# Patient Record
Sex: Female | Born: 1985 | Race: White | Hispanic: No | Marital: Married | Smoking: Never smoker
Health system: Southern US, Community
[De-identification: ages and names within clinical notes are randomized; demographics above are authoritative.]

## PROBLEM LIST (undated history)

## (undated) DIAGNOSIS — R519 Headache, unspecified: Secondary | ICD-10-CM

## (undated) DIAGNOSIS — R51 Headache: Secondary | ICD-10-CM

## (undated) DIAGNOSIS — K589 Irritable bowel syndrome without diarrhea: Secondary | ICD-10-CM

## (undated) HISTORY — DX: Irritable bowel syndrome, unspecified: K58.9

## (undated) HISTORY — DX: Headache, unspecified: R51.9

## (undated) HISTORY — PX: RHINOPLASTY: SUR1284

## (undated) HISTORY — DX: Headache: R51

---
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
## 2005-08-08 ENCOUNTER — Other Ambulatory Visit: Admission: RE | Admit: 2005-08-08 | Discharge: 2005-08-08 | Payer: Self-pay | Admitting: Gynecology

## 2005-11-10 ENCOUNTER — Other Ambulatory Visit: Admission: RE | Admit: 2005-11-10 | Discharge: 2005-11-10 | Payer: Self-pay | Admitting: Gynecology

## 2006-06-22 ENCOUNTER — Other Ambulatory Visit: Admission: RE | Admit: 2006-06-22 | Discharge: 2006-06-22 | Payer: Self-pay | Admitting: Gynecology

## 2006-08-10 DIAGNOSIS — D229 Melanocytic nevi, unspecified: Secondary | ICD-10-CM

## 2006-08-10 HISTORY — DX: Melanocytic nevi, unspecified: D22.9

## 2006-12-26 ENCOUNTER — Other Ambulatory Visit: Admission: RE | Admit: 2006-12-26 | Discharge: 2006-12-26 | Payer: Self-pay | Admitting: Gynecology

## 2008-01-07 ENCOUNTER — Encounter: Payer: Self-pay | Admitting: Women's Health

## 2008-01-07 ENCOUNTER — Ambulatory Visit: Payer: Self-pay | Admitting: Women's Health

## 2008-01-07 ENCOUNTER — Other Ambulatory Visit: Admission: RE | Admit: 2008-01-07 | Discharge: 2008-01-07 | Payer: Self-pay | Admitting: Gynecology

## 2010-05-27 NOTE — Op Note (Signed)
NAME:  Maria Schultz, Maria Schultz                            ACCOUNT NO.:  [id number]   MEDICAL RECORD NO.:  [id number]                   PATIENT TYPE:  AMB   LOCATION:  [facility]                                 FACILITY:  [facility]   PHYSICIAN:  Joanna Puff, M.D.            DATE OF BIRTH:  27-Mar-1985   DATE OF PROCEDURE:  DATE OF DISCHARGE:  06/30/2003                                 OPERATIVE REPORT   PREOPERATIVE DIAGNOSES:  1. Deviated septum secondary to trauma.  2. Nasal deformity secondary to trauma.   POSTOPERATIVE DIAGNOSES:  1. Deviated septum secondary to trauma.  2. Nasal deformity secondary to trauma.   OPERATION:  1. Nasal septoplasty.  2. Open reduction of nasal fracture.   SURGEON:  Joanna Puff, M.D.   ANESTHESIA:  General endotracheal by Ms. Aundria Rud.   DESCRIPTION OF PROCEDURE:  The patient was brought to the operating room and  placed in the supine position.  After adequate general endotracheal  anesthesia had been obtained, the head was draped in the usual manner.  Supplemental anesthesia was given through the nose by blocking and  infiltrating the nose with 1% Xylocaine with adrenaline and topical  application of 4% cocaine solution intranasally.   After adequate time for the local to work well, bilateral intercartilaginous  incisions were made and the upper lateral cartilages exposed.  Small  returning of the upper lateral cartilage was trimmed. The skin was then  further undermined over the dorsum of nose.  A rim incision was made and the  lower lateral cartilage were delivered. The ballooning margin of the lateral  crus was corrected with a complete strip technique.  A button end knife was  then inserted through the intercartilaginous incision and brought over the  caudal nose making for a complete transfixion incision.  Bilateral  mucoperichondrial and mucoperiosteal flaps were elevated over a severely  deviated septum which was markedly in the left airway  producing almost total  obstruction. The cartilaginous deflection was identified. It was then  removed by means of dissection. The marked deviation of the bony crest as  well as the perpendicular plate of the ethmoid and vomer was corrected with  chisel and rongeur. This allowed the septum to swing free back to the  midline. Out fracture of each inferior turbinate was then carried out.  Submucoperichondrial tunnels were then made beneath the junction of the  upper lateral cartilage and cartilaginous dorsum.  These two structures were  separated with angular scissors. The cartilaginous dorsum were then further  lowered slightly.  A small asymmetrical bony pyramid deformity which existed  from her injury was then removed.  Upper lateral cartilage and cartilaginous  dorsum were then further lowered to conform to the new height of the nasal  bony pyramid.  A vestibular incision was made and lateral osteotomies were  performed with the guarded chisel.  In fracture of nasal bone was  carried  out. Inspection revealed the septum and pyramid to be in good alignment.  The transfixion incision was closed with base suture of 3-0 plain catgut as  well as interrupted suture of 5-0 plain catgut. The rim incision was closed  with interrupted suture of 5-0 plain catgut. The intranasal splints were  inserted and sutured in place with 3-0 Ethilon. An external splint was  applied.  The patient was then awakened, extubated and returned to the  recovery room for reactivity prior to being discharged home.  She is  scheduled in the office in 24 hours for routine followup.   DISCHARGE MEDICATIONS:  1. Cephalexin 250, 1 q.i.d.  2. Sterapred 5 mg Dosepak.  3. Vicodin 7.5 mg 1 tablet q.3-4 h. p.r.n. for pain.   CONDITION ON DISCHARGE:  Good.   COMPLICATIONS:  None.                                               Joanna Puff, M.D.    LLP/MEDQ  D:  07/02/2003  T:  07/03/2003  Job:  [id number]

## 2013-01-10 ENCOUNTER — Encounter (HOSPITAL_COMMUNITY): Payer: Self-pay | Admitting: Emergency Medicine

## 2013-01-10 ENCOUNTER — Emergency Department (HOSPITAL_COMMUNITY)
Admission: EM | Admit: 2013-01-10 | Discharge: 2013-01-10 | Disposition: A | Discharge status: Home / Self Care | Payer: BC Managed Care – PPO | Source: Home / Self Care | Attending: Family Medicine | Admitting: Family Medicine

## 2013-01-10 DIAGNOSIS — J069 Acute upper respiratory infection, unspecified: Secondary | ICD-10-CM

## 2013-01-10 MED ORDER — IPRATROPIUM BROMIDE 0.06 % NA SOLN: 2.0000 | Freq: Four times a day (QID) | NASAL | Status: DC

## 2013-01-10 NOTE — ED Notes (Signed)
Pt  Reports     Symptoms      Of   Cough   Congested       And  sorethroat  With  The  Symptoms  X    4  Days    She  Is  Sitting  Upright on  Exam table  In no  Acute  Distress   Speaking in  Complete  sentances

## 2013-01-10 NOTE — ED Provider Notes (Signed)
CSN: [id number]     Arrival date & time 01/10/13  6294 History   First MD Initiated Contact with Patient 01/10/13 [phone]     Chief Complaint  Patient presents with  . URI   (Consider location/radiation/quality/duration/timing/severity/associated sxs/prior Treatment) Patient is a 28 y.o. female presenting with URI. The history is provided by the patient.  URI Presenting symptoms: congestion, rhinorrhea and sore throat   Presenting symptoms: no cough and no fever   Severity:  Mild Onset quality:  Gradual Duration:  5 days Progression:  Unchanged Chronicity:  New Ineffective treatments:  OTC medications Risk factors: sick contacts     History reviewed. No pertinent past medical history. History reviewed. No pertinent past surgical history. History reviewed. No pertinent family history. History  Substance Use Topics  . Smoking status: Never Smoker   . Smokeless tobacco: Not on file  . Alcohol Use: Yes   OB History   Grav Para Term Preterm Abortions TAB SAB Ect Mult Living                 Review of Systems  Constitutional: Negative.  Negative for fever.  HENT: Positive for congestion, postnasal drip, rhinorrhea and sore throat.   Respiratory: Negative for cough.     Allergies  Review of patient's allergies indicates no known allergies.  Home Medications   Current Outpatient Rx  Name  Route  Sig  Dispense  Refill  . ipratropium (ATROVENT) 0.06 % nasal spray   Nasal   Place 2 sprays into the nose 4 (four) times daily.   15 mL   1    BP 121/80  Pulse 90  Temp(Src) 98.3 F (36.8 C) (Oral)  Resp 14  SpO2 98%  LMP 12/25/2012 Physical Exam  Nursing note and vitals reviewed. Constitutional: She is oriented to person, place, and time. She appears well-developed and well-nourished.  HENT:  Head: Normocephalic.  Right Ear: External ear normal.  Left Ear: External ear normal.  Mouth/Throat: Oropharynx is clear and moist.  Eyes: Conjunctivae and EOM are normal. Pupils  are equal, round, and reactive to light.  Neck: Normal range of motion. Neck supple.  Pulmonary/Chest: Effort normal and breath sounds normal.  Lymphadenopathy:    She has no cervical adenopathy.  Neurological: She is alert and oriented to person, place, and time.  Skin: Skin is warm and dry.    ED Course  Procedures (including critical care time) Labs Review Labs Reviewed - No data to display Imaging Review No results found.  EKG Interpretation    Date/Time:    Ventricular Rate:    PR Interval:    QRS Duration:   QT Interval:    QTC Calculation:   R Axis:     Text Interpretation:              MDM      Billy Fischer, MD 01/10/13 [phone]

## 2013-01-10 NOTE — Discharge Instructions (Signed)
Drink plenty of fluids as discussed, use medicine as prescribed, and mucinex or delsym for cough. Return or see your doctor if further problems °

## 2014-01-28 ENCOUNTER — Other Ambulatory Visit: Payer: Self-pay | Admitting: Hematology and Oncology

## 2014-01-28 DIAGNOSIS — R591 Generalized enlarged lymph nodes: Secondary | ICD-10-CM

## 2014-01-30 ENCOUNTER — Ambulatory Visit (HOSPITAL_COMMUNITY)
Admission: RE | Admit: 2014-01-30 | Discharge: 2014-01-30 | Disposition: A | Discharge status: Home / Self Care | Payer: 59 | Source: Ambulatory Visit | Attending: Hematology and Oncology | Admitting: Hematology and Oncology

## 2014-01-30 ENCOUNTER — Encounter (HOSPITAL_COMMUNITY): Payer: Self-pay

## 2014-01-30 DIAGNOSIS — Z87440 Personal history of urinary (tract) infections: Secondary | ICD-10-CM | POA: Diagnosis not present

## 2014-01-30 DIAGNOSIS — R591 Generalized enlarged lymph nodes: Secondary | ICD-10-CM

## 2014-01-30 DIAGNOSIS — I890 Lymphedema, not elsewhere classified: Secondary | ICD-10-CM | POA: Insufficient documentation

## 2014-01-30 MED ORDER — IOHEXOL 300 MG/ML  SOLN
100.0000 mL | Freq: Once | INTRAMUSCULAR | Status: AC | PRN
  Administered 2014-01-30: 100 mL via INTRAVENOUS

## 2014-03-03 LAB — OB RESULTS CONSOLE RUBELLA ANTIBODY, IGM: RUBELLA: IMMUNE

## 2014-03-03 LAB — OB RESULTS CONSOLE HEPATITIS B SURFACE ANTIGEN: Hepatitis B Surface Ag: NEGATIVE

## 2014-03-03 LAB — OB RESULTS CONSOLE GC/CHLAMYDIA
Chlamydia: NEGATIVE
Gonorrhea: NEGATIVE

## 2014-03-03 LAB — OB RESULTS CONSOLE RPR: RPR: NONREACTIVE

## 2014-03-03 LAB — OB RESULTS CONSOLE ABO/RH: RH TYPE: POSITIVE

## 2014-03-03 LAB — OB RESULTS CONSOLE ANTIBODY SCREEN: Antibody Screen: NEGATIVE

## 2014-03-03 LAB — OB RESULTS CONSOLE HIV ANTIBODY (ROUTINE TESTING): HIV: NONREACTIVE

## 2014-09-21 LAB — OB RESULTS CONSOLE GBS: GBS: POSITIVE

## 2014-10-14 ENCOUNTER — Encounter (HOSPITAL_COMMUNITY): Payer: Self-pay | Admitting: *Deleted

## 2014-10-14 ENCOUNTER — Telehealth (HOSPITAL_COMMUNITY): Payer: Self-pay | Admitting: *Deleted

## 2014-10-14 NOTE — Telephone Encounter (Signed)
Preadmission screen  

## 2014-10-15 ENCOUNTER — Inpatient Hospital Stay (HOSPITAL_COMMUNITY)
Admission: RE | Admit: 2014-10-15 | Discharge: 2014-10-18 | DRG: 775 | Disposition: A | Discharge status: Home / Self Care | Payer: 59 | Source: Ambulatory Visit | Attending: Obstetrics and Gynecology | Admitting: Obstetrics and Gynecology

## 2014-10-15 ENCOUNTER — Encounter (HOSPITAL_COMMUNITY): Payer: Self-pay

## 2014-10-15 ENCOUNTER — Inpatient Hospital Stay (HOSPITAL_COMMUNITY): Payer: 59 | Admitting: Anesthesiology

## 2014-10-15 DIAGNOSIS — Z809 Family history of malignant neoplasm, unspecified: Secondary | ICD-10-CM

## 2014-10-15 DIAGNOSIS — O480 Post-term pregnancy: Secondary | ICD-10-CM | POA: Diagnosis present

## 2014-10-15 DIAGNOSIS — Z3A40 40 weeks gestation of pregnancy: Secondary | ICD-10-CM

## 2014-10-15 DIAGNOSIS — Z8249 Family history of ischemic heart disease and other diseases of the circulatory system: Secondary | ICD-10-CM

## 2014-10-15 DIAGNOSIS — Z833 Family history of diabetes mellitus: Secondary | ICD-10-CM

## 2014-10-15 LAB — COMPREHENSIVE METABOLIC PANEL
ALBUMIN: 2.7 g/dL — AB (ref 3.5–5.0)
ALT: 21 U/L (ref 14–54)
ANION GAP: 5 (ref 5–15)
AST: 26 U/L (ref 15–41)
Alkaline Phosphatase: 138 U/L — ABNORMAL HIGH (ref 38–126)
BILIRUBIN TOTAL: 0.5 mg/dL (ref 0.3–1.2)
BUN: 10 mg/dL (ref 6–20)
CHLORIDE: 107 mmol/L (ref 101–111)
CO2: 24 mmol/L (ref 22–32)
Calcium: 8.5 mg/dL — ABNORMAL LOW (ref 8.9–10.3)
Creatinine, Ser: 0.65 mg/dL (ref 0.44–1.00)
GFR calc Af Amer: >60 mL/min (ref >60)
Glucose, Bld: 87 mg/dL (ref 65–99)
POTASSIUM: 3.9 mmol/L (ref 3.5–5.1)
Sodium: 136 mmol/L (ref 135–145)
TOTAL PROTEIN: 5.6 g/dL — AB (ref 6.5–8.1)

## 2014-10-15 LAB — CBC
HCT: 38.7 % (ref 36.0–46.0)
HEMOGLOBIN: 13.1 g/dL (ref 12.0–15.0)
MCH: 29.7 pg (ref 26.0–34.0)
MCHC: 33.9 g/dL (ref 30.0–36.0)
MCV: 87.8 fL (ref 78.0–100.0)
PLATELETS: 144 10*3/uL — AB (ref 150–400)
RBC: 4.41 MIL/uL (ref 3.87–5.11)
RDW: 14.9 % (ref 11.5–15.5)
WBC: 9.6 10*3/uL (ref 4.0–10.5)

## 2014-10-15 LAB — RPR: RPR: NONREACTIVE

## 2014-10-15 LAB — TYPE AND SCREEN
ABO/RH(D): A POS
ANTIBODY SCREEN: NEGATIVE

## 2014-10-15 LAB — URIC ACID: URIC ACID, SERUM: 4.8 mg/dL (ref 2.3–6.6)

## 2014-10-15 LAB — ABO/RH: ABO/RH(D): A POS

## 2014-10-15 MED ORDER — PHENYLEPHRINE 40 MCG/ML (10ML) SYRINGE FOR IV PUSH (FOR BLOOD PRESSURE SUPPORT)
80.0000 ug | PREFILLED_SYRINGE | INTRAVENOUS | Status: DC | PRN
  Filled 2014-10-15: qty 20

## 2014-10-15 MED ORDER — ONDANSETRON HCL 4 MG/2ML IJ SOLN: 4.0000 mg | Freq: Four times a day (QID) | INTRAMUSCULAR | Status: DC | PRN

## 2014-10-15 MED ORDER — CITRIC ACID-SODIUM CITRATE 334-500 MG/5ML PO SOLN: 30.0000 mL | ORAL | Status: DC | PRN

## 2014-10-15 MED ORDER — OXYTOCIN BOLUS FROM INFUSION
500.0000 mL | INTRAVENOUS | Status: DC
  Administered 2014-10-16: 500 mL via INTRAVENOUS

## 2014-10-15 MED ORDER — LACTATED RINGERS IV SOLN
500.0000 mL | INTRAVENOUS | Status: DC | PRN
  Administered 2014-10-15: 500 mL via INTRAVENOUS

## 2014-10-15 MED ORDER — PENICILLIN G POTASSIUM 5000000 UNITS IJ SOLR
2.5000 10*6.[IU] | INTRAVENOUS | Status: DC
  Administered 2014-10-15 (×5): 2.5 10*6.[IU] via INTRAVENOUS
  Filled 2014-10-15 (×10): qty 2.5

## 2014-10-15 MED ORDER — FENTANYL 2.5 MCG/ML BUPIVACAINE 1/10 % EPIDURAL INFUSION (WH - ANES)
14.0000 mL/h | INTRAMUSCULAR | Status: DC | PRN
  Administered 2014-10-15 – 2014-10-16 (×3): 14 mL/h via EPIDURAL
  Filled 2014-10-15 (×2): qty 125

## 2014-10-15 MED ORDER — LIDOCAINE HCL (PF) 1 % IJ SOLN
INTRAMUSCULAR | Status: DC | PRN
  Administered 2014-10-15: 4 mL via EPIDURAL
  Administered 2014-10-15: 2 mL via EPIDURAL
  Administered 2014-10-15: 4 mL via EPIDURAL

## 2014-10-15 MED ORDER — OXYTOCIN 40 UNITS IN LACTATED RINGERS INFUSION - SIMPLE MED: 62.5000 mL/h | INTRAVENOUS | Status: DC

## 2014-10-15 MED ORDER — DEXTROSE 5 % IV SOLN
5.0000 10*6.[IU] | Freq: Once | INTRAVENOUS | Status: AC
  Administered 2014-10-15: 5 10*6.[IU] via INTRAVENOUS
  Filled 2014-10-15: qty 5

## 2014-10-15 MED ORDER — DIPHENHYDRAMINE HCL 50 MG/ML IJ SOLN: 12.5000 mg | INTRAMUSCULAR | Status: DC | PRN

## 2014-10-15 MED ORDER — OXYCODONE-ACETAMINOPHEN 5-325 MG PO TABS
1.0000 | ORAL_TABLET | ORAL | Status: DC | PRN
Start: 2014-10-15 — End: 2014-10-16

## 2014-10-15 MED ORDER — ACETAMINOPHEN 325 MG PO TABS
650.0000 mg | ORAL_TABLET | ORAL | Status: DC | PRN
  Administered 2014-10-15: 650 mg via ORAL
  Filled 2014-10-15: qty 2

## 2014-10-15 MED ORDER — MISOPROSTOL 25 MCG QUARTER TABLET
25.0000 ug | ORAL_TABLET | ORAL | Status: DC | PRN
  Administered 2014-10-15: 25 ug via VAGINAL
  Filled 2014-10-15: qty 0.25

## 2014-10-15 MED ORDER — LACTATED RINGERS IV SOLN
INTRAVENOUS | Status: DC
  Administered 2014-10-15 – 2014-10-16 (×4): via INTRAVENOUS

## 2014-10-15 MED ORDER — ZOLPIDEM TARTRATE 5 MG PO TABS: 5.0000 mg | ORAL_TABLET | Freq: Every evening | ORAL | Status: DC | PRN

## 2014-10-15 MED ORDER — OXYTOCIN 40 UNITS IN LACTATED RINGERS INFUSION - SIMPLE MED
1.0000 m[IU]/min | INTRAVENOUS | Status: DC
  Administered 2014-10-15: 2 m[IU]/min via INTRAVENOUS
  Filled 2014-10-15: qty 1000

## 2014-10-15 MED ORDER — LIDOCAINE HCL (PF) 1 % IJ SOLN
30.0000 mL | INTRAMUSCULAR | Status: AC | PRN
  Administered 2014-10-16: 30 mL via SUBCUTANEOUS
  Filled 2014-10-15: qty 30

## 2014-10-15 MED ORDER — TERBUTALINE SULFATE 1 MG/ML IJ SOLN: 0.2500 mg | Freq: Once | INTRAMUSCULAR | Status: DC | PRN

## 2014-10-15 MED ORDER — FLEET ENEMA 7-19 GM/118ML RE ENEM: 1.0000 | ENEMA | RECTAL | Status: DC | PRN

## 2014-10-15 MED ORDER — OXYCODONE-ACETAMINOPHEN 5-325 MG PO TABS: 2.0000 | ORAL_TABLET | ORAL | Status: DC | PRN

## 2014-10-15 MED ORDER — EPHEDRINE 5 MG/ML INJ
10.0000 mg | INTRAVENOUS | Status: DC | PRN
Start: 2014-10-15 — End: 2014-10-16

## 2014-10-15 NOTE — H&P (Signed)
Maria Schultz is a 29 y.o. female presenting for post dates induction of labor. Received one dose of Cytotec last pm. Maternal Medical History:  Fetal activity: Perceived fetal activity is normal.      OB History    Gravida Para Term Preterm AB TAB SAB Ectopic Multiple Living   1              Past Medical History  Diagnosis Date  . IBS (irritable bowel syndrome)   . Headache    Past Surgical History  Procedure Laterality Date  . Rhinoplasty     Family History: family history includes Cancer in her maternal grandfather and maternal grandmother; Diabetes in her paternal grandmother; Hypertension in her maternal grandmother; Migraines in her sister. Social History:  reports that she has never smoked. She has never used smokeless tobacco. She reports that she does not drink alcohol or use illicit drugs.   Prenatal Transfer Tool  Maternal Diabetes: No Genetic Screening: Normal Maternal Ultrasounds/Referrals: Normal Fetal Ultrasounds or other Referrals:  None Maternal Substance Abuse:  No Significant Maternal Medications:  None Significant Maternal Lab Results:  None Other Comments:  None  Review of Systems  Eyes: Negative for blurred vision.  Gastrointestinal: Negative for abdominal pain.  Neurological: Negative for headaches.    Dilation: 1 Effacement (%): Thick Station: -3 Exam by:: L.Stubbs, RN Blood pressure 121/74, pulse 73, temperature 97.6 F (36.4 C), temperature source Oral, resp. rate 18, height 5\' 6"  (1.676 m), weight 168 lb (76.204 kg), last menstrual period 01/04/2014. Maternal Exam:  Abdomen: Fetal presentation: vertex     Fetal Exam Fetal State Assessment: Category I - tracings are normal.     Physical Exam  Cardiovascular: Normal rate and regular rhythm.   Respiratory: Effort normal and breath sounds normal.  GI: Soft. There is no tenderness.    Filed Vitals:   10/15/14 0801  BP: 121/74  Pulse: 73  Temp: 97.6 F (36.4 C)  Resp: 18     Prenatal labs: ABO, Rh: --/--/A POS, A POS (10/06 0105) Antibody: NEG (10/06 0105) Rubella: Immune (02/23 0000) RPR: Nonreactive (02/23 0000)  HBsAg: Negative (02/23 0000)  HIV: Non-reactive (02/23 0000)  GBS: Positive (09/12 0000)   Assessment/Plan: 29 yo G1P0 for induction Pitocin now on BP 130s/90s x 1, no preeclampsia sxs Will check CMET and Uric acid   Maria Schultz,Maria Schultz 10/15/2014, 8:18 AM

## 2014-10-15 NOTE — Progress Notes (Signed)
FHT cat one UCs q2-4 Cx 4-5/C/-1 IUPC placed

## 2014-10-15 NOTE — Anesthesia Preprocedure Evaluation (Signed)
Anesthesia Evaluation  Patient identified by MRN, date of birth, ID band Patient awake    Reviewed: Allergy & Precautions, H&P , NPO status , Patient's Chart, lab work & pertinent test results  Airway Mallampati: II  TM Distance: >3 FB Neck ROM: full    Dental no notable dental hx.    Pulmonary neg pulmonary ROS,    Pulmonary exam normal breath sounds clear to auscultation       Cardiovascular negative cardio ROS Normal cardiovascular exam Rhythm:regular Rate:Normal     Neuro/Psych negative neurological ROS  negative psych ROS   GI/Hepatic negative GI ROS, Neg liver ROS,   Endo/Other  negative endocrine ROS  Renal/GU negative Renal ROS  negative genitourinary   Musculoskeletal   Abdominal   Peds  Hematology negative hematology ROS (+)   Anesthesia Other Findings   Reproductive/Obstetrics (+) Pregnancy                             Anesthesia Physical Anesthesia Plan  ASA: II  Anesthesia Plan: Epidural   Post-op Pain Management:    Induction:   Airway Management Planned:   Additional Equipment:   Intra-op Plan:   Post-operative Plan:   Informed Consent: I have reviewed the patients History and Physical, chart, labs and discussed the procedure including the risks, benefits and alternatives for the proposed anesthesia with the patient or authorized representative who has indicated his/her understanding and acceptance.     Plan Discussed with:   Anesthesia Plan Comments:         Anesthesia Quick Evaluation

## 2014-10-15 NOTE — Progress Notes (Signed)
Epidural in FHT cat one UCs q2-3 min Cx 4/C/vtx/_2 AROM clear

## 2014-10-15 NOTE — Anesthesia Procedure Notes (Signed)
Epidural Patient location during procedure: OB  Staffing Anesthesiologist: Abou Sterkel Performed by: anesthesiologist   Preanesthetic Checklist Completed: patient identified, site marked, surgical consent, pre-op evaluation, timeout performed, IV checked, risks and benefits discussed and monitors and equipment checked  Epidural Patient position: sitting Prep: site prepped and draped and DuraPrep Patient monitoring: continuous pulse ox and blood pressure Approach: midline Location: L3-L4 Injection technique: LOR saline  Needle:  Needle type: Tuohy  Needle gauge: 17 G Needle length: 9 cm and 9 Needle insertion depth: 5 cm cm Catheter type: closed end flexible Catheter size: 19 Gauge Catheter at skin depth: 9 cm Test dose: negative  Assessment Events: blood not aspirated, injection not painful, no injection resistance, negative IV test and no paresthesia  Additional Notes Patient identified. Risks/Benefits/Options discussed with patient including but not limited to bleeding, infection, nerve damage, paralysis, failed block, incomplete pain control, headache, blood pressure changes, nausea, vomiting, reactions to medications, itching and postpartum back pain. Confirmed with bedside nurse the patient's most recent platelet count. Confirmed with patient that they are not currently taking any anticoagulation, have any bleeding history or any family history of bleeding disorders. Patient expressed understanding and wished to proceed. All questions were answered. Sterile technique was used throughout the entire procedure. Please see nursing notes for vital signs. Test dose was given through epidural catheter and negative prior to continuing to dose epidural or start infusion. Warning signs of high block given to the patient including shortness of breath, tingling/numbness in hands, complete motor block, or any concerning symptoms with instructions to call for help. Patient was given  instructions on fall risk and not to get out of bed. All questions and concerns addressed with instructions to call with any issues or inadequate analgesia.

## 2014-10-16 ENCOUNTER — Encounter (HOSPITAL_COMMUNITY): Payer: Self-pay

## 2014-10-16 LAB — CBC
HCT: 38 % (ref 36.0–46.0)
HEMOGLOBIN: 12.8 g/dL (ref 12.0–15.0)
MCH: 30 pg (ref 26.0–34.0)
MCHC: 33.7 g/dL (ref 30.0–36.0)
MCV: 89 fL (ref 78.0–100.0)
Platelets: 114 10*3/uL — ABNORMAL LOW (ref 150–400)
RBC: 4.27 MIL/uL (ref 3.87–5.11)
RDW: 15.2 % (ref 11.5–15.5)
WBC: 20.8 10*3/uL — ABNORMAL HIGH (ref 4.0–10.5)

## 2014-10-16 LAB — CCBB MATERNAL DONOR DRAW

## 2014-10-16 MED ORDER — WITCH HAZEL-GLYCERIN EX PADS
1.0000 "application " | MEDICATED_PAD | CUTANEOUS | Status: DC | PRN
  Administered 2014-10-16: 1 via TOPICAL

## 2014-10-16 MED ORDER — DIBUCAINE 1 % RE OINT
1.0000 "application " | TOPICAL_OINTMENT | RECTAL | Status: DC | PRN
  Administered 2014-10-16: 1 via RECTAL
  Filled 2014-10-16: qty 28

## 2014-10-16 MED ORDER — ACETAMINOPHEN 325 MG PO TABS: 650.0000 mg | ORAL_TABLET | ORAL | Status: DC | PRN

## 2014-10-16 MED ORDER — ONDANSETRON HCL 4 MG/2ML IJ SOLN: 4.0000 mg | INTRAMUSCULAR | Status: DC | PRN

## 2014-10-16 MED ORDER — OXYCODONE-ACETAMINOPHEN 5-325 MG PO TABS: 2.0000 | ORAL_TABLET | ORAL | Status: DC | PRN

## 2014-10-16 MED ORDER — PRENATAL MULTIVITAMIN CH
1.0000 | ORAL_TABLET | Freq: Every day | ORAL | Status: DC
  Administered 2014-10-17: 1 via ORAL
  Filled 2014-10-16 (×2): qty 1

## 2014-10-16 MED ORDER — DIPHENHYDRAMINE HCL 25 MG PO CAPS: 25.0000 mg | ORAL_CAPSULE | Freq: Four times a day (QID) | ORAL | Status: DC | PRN

## 2014-10-16 MED ORDER — SIMETHICONE 80 MG PO CHEW: 80.0000 mg | CHEWABLE_TABLET | ORAL | Status: DC | PRN

## 2014-10-16 MED ORDER — ZOLPIDEM TARTRATE 5 MG PO TABS: 5.0000 mg | ORAL_TABLET | Freq: Every evening | ORAL | Status: DC | PRN

## 2014-10-16 MED ORDER — BENZOCAINE-MENTHOL 20-0.5 % EX AERO
1.0000 "application " | INHALATION_SPRAY | CUTANEOUS | Status: DC | PRN
  Administered 2014-10-16: 1 via TOPICAL
  Filled 2014-10-16: qty 56

## 2014-10-16 MED ORDER — OXYCODONE-ACETAMINOPHEN 5-325 MG PO TABS: 1.0000 | ORAL_TABLET | ORAL | Status: DC | PRN

## 2014-10-16 MED ORDER — IBUPROFEN 600 MG PO TABS
600.0000 mg | ORAL_TABLET | Freq: Four times a day (QID) | ORAL | Status: DC
  Administered 2014-10-16 – 2014-10-18 (×9): 600 mg via ORAL
  Filled 2014-10-16 (×9): qty 1

## 2014-10-16 MED ORDER — LANOLIN HYDROUS EX OINT: TOPICAL_OINTMENT | CUTANEOUS | Status: DC | PRN

## 2014-10-16 MED ORDER — TETANUS-DIPHTH-ACELL PERTUSSIS 5-2.5-18.5 LF-MCG/0.5 IM SUSP: 0.5000 mL | Freq: Once | INTRAMUSCULAR | Status: DC

## 2014-10-16 MED ORDER — ONDANSETRON HCL 4 MG PO TABS: 4.0000 mg | ORAL_TABLET | ORAL | Status: DC | PRN

## 2014-10-16 MED ORDER — SENNOSIDES-DOCUSATE SODIUM 8.6-50 MG PO TABS
2.0000 | ORAL_TABLET | ORAL | Status: DC
  Administered 2014-10-16 – 2014-10-17 (×2): 2 via ORAL
  Filled 2014-10-16 (×2): qty 2

## 2014-10-16 NOTE — Anesthesia Postprocedure Evaluation (Signed)
  Anesthesia Post-op Note  Patient: Maria Schultz  Procedure(s) Performed: * No procedures listed *  Patient Location: PACU and Mother/Baby  Anesthesia Type:Epidural  Level of Consciousness: awake, alert  and oriented  Airway and Oxygen Therapy: Patient Spontanous Breathing  Post-op Pain: none  Post-op Assessment: Post-op Vital signs reviewed, Patient's Cardiovascular Status Stable, No headache, No backache, No residual numbness and No residual motor weakness  Post-op Vital Signs: Reviewed and stable  Complications: No apparent anesthesia complications

## 2014-10-16 NOTE — Lactation Note (Signed)
This note was copied from the chart of Maria Schultz. Lactation Consultation Note     Initial consult with this first time mom and term baby, now 1 hours old. Mom was getting ready to latch the baby as I walked in. Mom reports severe nipple pain  with previous latches.  On exam of baby's mouth, she has a thick, upper lip frenulum that extends to the gum line, and a posterior frenulum that causes her tongue to form a bowl, and the baby has a high palate. This frenulum  placement and length may be   the cause of mom's discomfort. Dad said he had his lip frenulum clipped as a baby. 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  I explained that a nipple shield is used to help the baby stay latched, but is a transitional tool. It may not be needed in time, and the baby may not need any intervention. I encouraged the mom to do lots of skin to skin.  I assisted mom with latching in cross cradle hold, with a 24 nipple shield. Mom reports this feeling much more comfortable. I then tried a 20 nipple shield on mom, and this was a better fit. The baby relatched, and at this time mom's colostrum was flowing some. It appeared the baby was swallowing while the 24 shield was in place, but I did not see any colostrum in the shield. The baby also appeared to be swallowing when latched with the 20 shield.  Mom was to be set up with a DEP, to pump about 4-6 times a day, to protect her supply and provide EBm as supplement for the baby. Mom will call for help with feeding EBM to the baby, at the breast, into the shield.  Basic teaching on breastfeeding done from the Baby and Me book, and lactation services also reviewed. Mom knows to call for questions/concerns. I also advised mom to make an o/p consult appointment at discharge, since she was using a nipple shield. Parents very receptive to teaching.   Patient Name: Maria Schultz Today's Date: 10/16/2014    Maternal  Data Formula Feeding for Exclusion: No Has patient been taught Hand Expression?: Yes Does the patient have breastfeeding experience prior to this delivery?: No  Feeding Feeding Type: Breast Fed Length of feed: 20 min (baby relatched with 20 nipple shiled a couple of minutes after this feeding)  LATCH Score/Interventions Latch: Repeated attempts needed to sustain latch, nipple held in mouth throughout feeding, stimulation needed to elicit sucking reflex. (baby latched with 24 nipple shield , used to to latchich causing severe pain to mom's nipples without) Intervention(s): Adjust position;Assist with latch;Breast compression  Audible Swallowing: A few with stimulation (visible swallows seen , a few heard, but no colostrum sen in shield after feeding) Intervention(s): Skin to skin;Hand expression  Type of Nipple: Everted at rest and after stimulation  Comfort (Breast/Nipple): Filling, red/small blisters or bruises, mild/mod discomfort  Problem noted: Mild/Moderate discomfort Interventions (Mild/moderate discomfort): Hand massage;Hand expression  Hold (Positioning): Assistance needed to correctly position infant at breast and maintain latch. 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

## 2014-10-16 NOTE — Progress Notes (Signed)
Delivery Note At 1:23 AM a viable female was delivered via Vaginal, Spontaneous Delivery (Presentation: ;  ).  APGAR: 2, 6, 9; weight  .   Placenta status: Intact, Spontaneous Pathology.  Cord: 3 vessels with the following complications: None.  Cord pH: 7.03 (drawn about 6+ minutes after delivery) Second degree MLE done. Spontaneous delivery of vertex. Easy delivery of shoulders with flexion of hips. Loose nuchal cord x 1. Thin terminal meconium noted at delivery. Cord clamped and cut and baby taken to warming station for stimulation and bag O2. Good heart rate and intermittent cry. Code apgar called and peds responded.  Anesthesia: Epidural  Episiotomy: Median Lacerations: 2nd degree Suture Repair: 3.0 vicryl rapide Est. Blood Loss (mL):  250  Mom to postpartum.  Baby to Couplet care / Skin to Skin.  Jennet Scroggin II,Kimm Ungaro E 10/16/2014, 1:46 AM

## 2014-10-16 NOTE — Progress Notes (Signed)
Post Partum Day zero Subjective: no complaints  Objective: Blood pressure 130/83, pulse 74, temperature 98.3 F (36.8 C), temperature source Oral, resp. rate 18, height 5\' 6"  (1.676 m), weight 76.204 kg (168 lb), last menstrual period 01/04/2014, SpO2 99 %, unknown if currently breastfeeding.  Physical Exam:  General: alert Lochia: appropriate Uterine Fundus: firm Incision: healing well DVT Evaluation: No evidence of DVT seen on physical exam.   Recent Labs  10/15/14 0105 10/16/14 0618  HGB 13.1 12.8  HCT 38.7 38.0    Assessment/Plan: Plan for discharge tomorrow   LOS: 1 day   Maria Schultz S 10/16/2014, 11:04 AM

## 2014-10-17 NOTE — Progress Notes (Signed)
Post Partum Day one Subjective: no complaints  Objective: Blood pressure 120/80, pulse 75, temperature 98.3 F (36.8 C), temperature source Oral, resp. rate 20, height 5\' 6"  (1.676 m), weight 76.204 kg (168 lb), last menstrual period 01/04/2014, SpO2 98 %, unknown if currently breastfeeding.  Physical Exam:  General: alert Lochia: appropriate Uterine Fundus: firm Incision: na DVT Evaluation: No evidence of DVT seen on physical exam.   Recent Labs  10/15/14 0105 10/16/14 0618  HGB 13.1 12.8  HCT 38.7 38.0    Assessment/Plan: Plan for discharge tomorrow   LOS: 2 days   Todd Jelinski S 10/17/2014, 9:50 AM

## 2014-10-17 NOTE — Lactation Note (Signed)
This note was copied from the chart of Maria Schultz. Lactation Consultation Note  Patient Name: Maria Schultz [id number] Date: 10/17/2014 Reason for consult: Follow-up assessment   Follow up assessment with mom, dad and Maria Schultz. Infant is 42 hours old with 12 BF for 10- 45 minutes, 2 attempts, 5 voids and 0 stools and 4 % weight loss in last 24 hours. Infant was MSF. Mom reports she is not using nipple shield since yesterday and is able to get the infant latched easily. She does have pain with initial latch and has noted that pain is very much diminished with feeding since yesterday. Mom is using Comfort Gels. She was given Inverted Nipple shells for sore nipples with instructions for use and not wearing at night while sleeping. Mom has not been pumping since infant is nursing well. Discussed BF basics, NL NB feeding behavior, supply and demand, cluster feeds. Stomach size, nutritional needs, Nipple care, awakening techniques, and I/O. Left and returned to room with Pump information for [employer] employees, mom had infant latched to left breast in cross cradle hold. Infant was positioned well at the breast with lips flanged and audible swallows. Infant was a little sleepy and did need stimulation to keep awake.  Parents report that infant was clicking with feeding yesterday and they have not noticed that today. Encouragement and praise given. Parents receptive to all teaching. Will need to get a personal Breast pump prior to D/C. Told to call PRN assistance/questions/ or concerns.   Maternal Data Formula Feeding for Exclusion: No Does the patient have breastfeeding experience prior to this delivery?: No  Feeding Feeding Type: Breast Fed Length of feed: 20 min  LATCH Score/Interventions Latch: Repeated attempts needed to sustain latch, nipple held in mouth throughout feeding, stimulation needed to elicit sucking reflex. Intervention(s): Adjust position;Assist with latch;Breast massage;Breast  compression  Audible Swallowing: Spontaneous and intermittent Intervention(s): Skin to skin  Type of Nipple: Everted at rest and after stimulation  Comfort (Breast/Nipple): Filling, red/small blisters or bruises, mild/mod discomfort  Problem noted: Cracked, bleeding, blisters, bruises;Mild/Moderate discomfort Interventions  (Cracked/bleeding/bruising/blister): Expressed breast milk to nipple (Given Breast Shells with instructions for use)  Hold (Positioning): No assistance needed to correctly position infant at breast. Intervention(s): Breastfeeding basics reviewed;Support Pillows;Position options;Skin to skin  LATCH Score: 8  Lactation Tools Discussed/Used Tools: Shells;Comfort gels   Consult Status Consult Status: Follow-up Follow-up type: Call as needed    Donn Pierini 10/17/2014, 5:38 PM

## 2014-10-18 NOTE — Lactation Note (Signed)
This note was copied from the chart of Maria Schultz. Lactation Consultation Note  Follow up with parent prior to D/C. Infant with 9 BF, 3 voids and 1 stool today after suppository and 7% weight loss. LS 8-9. Mom reports her breast are feeling fuller today. Mom has not used a NS in over 24 hours. Infant was asleep in moms arms after recently feeding, infant awakened and mom latched to right breast independently in cross cradle hold using appropriate positioning and support. Infant needed assistance with upper lip flanging, encouraged parents to check lips with each latch and flange as needed. Mom reports there is pain with latch that diminishes significantly after a few sucks. Enc mom to continue with expressed colostrum and apply to nipples post feed and use Comfort Gels and breast shells between feeds. Informed parents that olive oil and coconut oils can also be used. Infant nursed with several sucking bursts with audible swallows/gulps. Parents are great about awakening baby as needed. Mom massaged/compressed breasts throughout feeding. Advised mom that if nipple soreness worsens or is not resolving after a week to call and schedule an OP appt. BF Basics, NB Feeding behavior, cluster feeds, expected I/O, Engorgement prevention, introducing bottles/pacifiers, pumping for return to work all discussed. Dr. Rex Kras had spoken with me prior to going into room and wanted to supplement baby due to weight loss, and decreased stooling, Parents do not wish to supplement with formula but are willing to do what is in the baby's best interest. I did speak with Dr. Rex Kras after assessing the baby at the breast and notified him of increasing milk supply, gulps heard during feeding and that infant recently stooled and he agreed to not start supplement and for parents to F/U Tuesday with Ped Office. Discussed NL output per day of age with parents, and Enc parent to maintain I/O records and take to ped office for visit.  REferred parents to BF information in Taking Care of Baby and Me booklet. Reiterated [place] handout and phone #, OP services, Support Groups and BF Resources with parents. Assisted parents with obtaining Medela PIS Advanced as a [employer] Employee. All questions answered. Enc. Mom to call with any questions/concerns.   Patient Name: Maria Schultz [id number] Date: 10/18/2014 Reason for consult: Follow-up assessment;Infant weight loss   Maternal Data Formula Feeding for Exclusion: No Has patient been taught Hand Expression?: Yes Does the patient have breastfeeding experience prior to this delivery?: No  Feeding Feeding Type: Breast Fed Length of feed: 25 min  LATCH Score/Interventions Latch: Grasps breast easily, tongue down, lips flanged, rhythmical sucking. Intervention(s): Adjust position;Assist with latch;Breast massage;Breast compression  Audible Swallowing: Spontaneous and intermittent Intervention(s): Skin to skin;Hand expression  Type of Nipple: Everted at rest and after stimulation  Comfort (Breast/Nipple): Filling, red/small blisters or bruises, mild/mod discomfort  Problem noted: Cracked, bleeding, blisters, bruises;Mild/Moderate discomfort Interventions  (Cracked/bleeding/bruising/blister): Expressed breast milk to nipple Interventions (Mild/moderate discomfort): Comfort gels (Breast Shells)  Hold (Positioning): No assistance needed to correctly position infant at breast. Intervention(s): Breastfeeding basics reviewed;Support Pillows;Position options;Skin to skin  LATCH Score: 9  Lactation Tools Discussed/Used Tools: Shells;Comfort gels Shell Type: Inverted WIC Program: No Pump Review: Setup, frequency, and cleaning;Milk Storage Initiated by:: Nonah Mattes, RN, IBCLC Date initiated:: 10/18/14   Consult Status Consult Status: Complete Follow-up type: Call as needed    Debby Freiberg Humberto Addo 10/18/2014, 10:31 AM

## 2014-10-18 NOTE — Discharge Summary (Signed)
Obstetric Discharge Summary Reason for Admission: induction of labor Prenatal Procedures: none Intrapartum Procedures: spontaneous vaginal delivery Postpartum Procedures: none Complications-Operative and Postpartum: second degree perineal laceration HEMOGLOBIN  Date Value Ref Range Status  10/16/2014 12.8 12.0 - 15.0 g/dL Final   HCT  Date Value Ref Range Status  10/16/2014 38.0 36.0 - 46.0 % Final    Physical Exam:  General: alert Lochia: appropriate Uterine Fundus: firm Incision: na DVT Evaluation: No evidence of DVT seen on physical exam.  Discharge Diagnoses: Term Pregnancy-delivered  Discharge Information: Date: 10/18/2014 Activity: pelvic rest Diet: routine Medications: PNV Condition: stable Instructions: refer to practice specific booklet Discharge to: home   Newborn Data: Live born female  Birth Weight: 8 lb 2.2 oz (3690 g) APGAR: 2, 6  Home with mother.  Maria Schultz S 10/18/2014, 9:10 AM

## 2014-10-19 ENCOUNTER — Inpatient Hospital Stay (HOSPITAL_COMMUNITY): Admission: AD | Admit: 2014-10-19 | Payer: 59 | Source: Ambulatory Visit | Admitting: Obstetrics & Gynecology

## 2014-10-20 ENCOUNTER — Ambulatory Visit (HOSPITAL_COMMUNITY)
Admission: RE | Admit: 2014-10-20 | Discharge: 2014-10-20 | Disposition: A | Discharge status: Home / Self Care | Payer: 59 | Source: Ambulatory Visit | Attending: Obstetrics & Gynecology | Admitting: Obstetrics & Gynecology

## 2014-10-20 NOTE — Lactation Note (Addendum)
Lactation Consult  Mother's reason for visit:  Mom c/o of pain with latch. Cracked nipples, engorgement.  Visit Type:  Outpatient - feeding assessment Appointment Notes:  Mom is engorged, bilateral nipples are cracked with scabs. Mom reports using EBM/comfort gels prn. Yesterday Mom reports her nipples were too sore to latch with every feeding, FOB was finger feeding the baby. She used nipple shield on/off but reports not using the past few days. Baby Maria Schultz now 17 days old.  Consult:  Initial Lactation Consultant:  Katrine Coho  ________________________________________________________________________   31 Name: Maria Schultz Date of Birth: 10/16/2014 Pediatrician: Dr. Burt Knack - [facility] Gender: female Gestational Age: [redacted]w[redacted]d (At Birth) Birth Weight: 8 lb 2.2 oz (3690 g) Weight at Discharge: Weight: 7 lb 8.5 oz (3415 g)Date of Discharge: 10/18/2014 Filed Weights   10/16/14 0123 10/16/14 2322 10/17/14 2310  Weight: 8 lb 2.2 oz (3690 g) 7 lb 13.6 oz (3560 g) 7 lb 8.5 oz (3415 g)   Last weight taken from location outside of [facility]: 10/20/14  7 lb. 13.0 oz Location:Pediatrician's office Weight today: 7 lb. 12.7 oz/3534 gm    ________________________________________________________________________  Mother's Name: Maria Schultz Type of delivery:  SVB Breastfeeding Experience:  P1 Maternal Medical Conditions:  IBS   ________________________________________________________________________  Breastfeeding History (Post Discharge)  Frequency of breastfeeding:  Evert 2-3 hours Duration of feeding: 30  Minutes on average    Pumping  Type of pump:  Medela pump in style Frequency:  Every 4 hours Volume:  90 ml  Infant Intake and Output Assessment  Voids:  4 in 24 hrs.  Color:  Clear yellow Stools:  4 in 24 hrs.  Color:  Yellow  ________________________________________________________________________  Maternal Breast  Assessment  Breast:  Engorged Nipple:  Erect and Cracked Pain level:  10 Pain interventions:  Comfort gels/EBM  _______________________________________________________________________ Feeding Assessment/Evaluation  Initial feeding assessment:  Infant's oral assessment:  Variance. Short, labial frenulum along with short, posterior lingual frenulum.   Positioning:  Cross cradle Left breast  LATCH documentation:  Initially latched baby without nipple shield but Mom's PS=10. Used #24 nipple shield for the feeding. Baby Maria Schultz has difficulty keeping lips well flanged at the breast, however with nipple shield Mom PS improved to 3 and Mom's nipple was round when baby came off the breast after nursing for 23 minutes. No bleeding observed. Left breast softened slightly but not engorgement not completely resolved. Scant amount of milk observed in nipple shield with feeding.   Attached assessment:  Shallow without nipple shield. More depth obtain with nipple shield as baby was nursing.   Lips flanged:  No.  Lips untucked:  No.  Suck assessment:  Nutritive  Tools:  Nipple shield 24 mm Instructed on use and cleaning of tool:  Yes.    Pre-feed weight:  3534 g  (7 lb. 12.7 oz.) Post-feed weight:  3546 g (7 lb. 13.1 oz.) Amount transferred:  12 ml with nursing for 23 minutes.    Additional Feeding Assessment -   Infant's oral assessment:  Variance  Positioning:  Football Right breast  LATCH documentation:  Latched Baby Maria Schultz to right breast using #20 nipple shield. Mom's initial PS=5 but resolved to 3. Baby Maria Schultz did obtain more depth with smaller nipple shield and good amount of breast milk in the nipple shield at the end of the feeding.     Attached assessment:  Shallow initially, baby obtained more depth with nursing  Lips flanged:  No.  Lips untucked:  No.  Suck assessment:  Nutritive  Tools:  Nipple shield 20 mm Instructed on use and cleaning of tool:  Yes.     Pre-feed weight:  3546 g  (7 lb. 13.1 oz.) Post-feed weight:  3580 g (7 lb. 14.3 oz.) Amount transferred:  34 ml  With nursing for 25 minutes Amount supplemented:  12 ml  EBM. FOB gave EBM via 5 fr feeding tube/syringe after LC demonstrated how to use this system.    Total amount pumped post feed:  R 41 ml    L 39 ml  Total amount transferred:  46 ml Total supplement given:  12 ml  Engorgement care reviewed with Mom: Advised Mom not to miss any feedings, baby should be at the breast every 2-3 hours. Try to keep baby nursing for 15-20 minutes both breasts each feeding.  If her breast are becoming full and baby not ready to latch, pre-pump to relieve fullness, apply ice then BF within the next hour. If Mom has difficulty getting milk to flow, sit in a warm bath with breasts covered with warm water, massage to get milk moving, then BF baby, post pump as needed to soften breast and apply ice packs. Follow this routine for the next 24-48 hours till engorgement resolved. If these measure not working then may use cabbage leaves 1-2 times in 24 hours. For Sore nipples: Call OB for [facility], use EBM prn, comfort gels given with instructions. Use #20 nipple shield to latch baby till engorgement resolved and has f/u with LC next week. Baby transferred better with #20 nipple shield than 24 and Mom was just as comfortable with baby at the breast.  If Mom cannot tolerate baby at the breast be sure to pump to empty breast well every 2-3 hours. To supplement can use 5 fr feeding tube/syringe for small amounts however with larger amounts use Dr. Owens Shark bottle with #1 nipple. Information given to parents to research frenulum/frenotomy if interested. OP f/u scheduled for Monday, 10/26/14 at 0900.

## 2014-10-26 ENCOUNTER — Ambulatory Visit (HOSPITAL_COMMUNITY)
Admission: RE | Admit: 2014-10-26 | Discharge: 2014-10-26 | Disposition: A | Discharge status: Home / Self Care | Payer: 59 | Source: Ambulatory Visit | Attending: Obstetrics & Gynecology | Admitting: Obstetrics & Gynecology

## 2014-10-26 NOTE — Lactation Note (Signed)
Lactation Consult  Mother's reason for visit:  Sore nipples/difficult latch Visit Type:  Out patient follow up Appointment Notes:   Consult:  Follow-Up Lactation Consultant:  Broadus John  ________________________________________________________________________  Sharene Skeans Name: Maria Schultz Date of Birth: 10/16/2014 Pediatrician: Dr Rosalyn Charters Gender: female Gestational Age: [redacted]w[redacted]d (At Birth) Birth Weight: 8 lb 2.2 oz (3690 g) Weight at Discharge: Weight: 7 lb 8.5 oz (3415 g)Date of Discharge: 10/18/2014 [facility] Weights   10/16/14 0123 10/16/14 2322 10/17/14 2310  Weight: 8 lb 2.2 oz (3690 g) 7 lb 13.6 oz (3560 g) 7 lb 8.5 oz (3415 g)   Last weight taken from location outside of [facility]: 7lbs 12oz 10/22/14 Location:{Outpt. wt. l Weight today: 8 lbs. 2.7oz   After last OP appointment for sore nipples and engorgement, Maria Schultz decided to primarily pump and bottle feed for 3 days to heal her nipples.  She is here to receive assistance re-latching baby to the breast.  Maria Schultz has an abundant milk supply, breasts are full, nipples erect and intact.  Discussed importance of proper positioning to facilitate a wide and deep latch to the breast.  Assisted with using cross cradle hold, and hand placement in a U position, and firm support of baby's head.  Latched baby a couple times to demonstrate breaking the suction, and inspecting her nipple.  Maria Schultz was able to use alternating breast compression to facilitate continued nutritive sucking.  Maria Schultz described a comfortable latch throughout the 20 minutes of breast feeding. After both breasts, baby transferred 80 ml.  (Baby had 30 ml 1 hr prior to appointment)  Nipples look rounded and no trauma noted.  Reviewed basics of positioning and latch.  Comfort Gels given.  Information on Breast Feeding Support Group given, and encouraged.  To call prn for an  assistance.    ________________________________________________________________________  Mother's Name: Maria Schultz Type of delivery:  Vaginal Breastfeeding Experience: none Maternal Medical Conditions:  None Maternal Medications:  PNV  ________________________________________________________________________  Breastfeeding History (Post Discharge)  Frequency of breastfeeding: 1-2 times a day for 3-4 days Duration of feeding:  15 mins on and off  Supplementation   Breastmilk:  Volume 75-90 ml Frequency: q 2-4 hrs Total volume per day: 540 ml  Method:  Bottle,   Pumping  Type of pump:  Medela pump in style Frequency: q 2-4 hrs Volume:  30-300  ml  Infant Intake and Output Assessment  Voids:  8-11 in 24 hrs.  Color:  Clear yellow Stools:  3-4 in 24 hrs.  Color:  Yellow  ________________________________________________________________________  Maternal Breast Assessment  Breast:  Full Nipple:  Erect Pain level:  0 Pain interventions:  Breast pump  _______________________________________________________________________ Feeding Assessment/Evaluation  Initial feeding assessment:  Infant's oral assessment:  WNL  Positioning:  Cross cradle Right breast  LATCH documentation:  Latch:  2 = Grasps breast easily, tongue down, lips flanged, rhythmical sucking.  Audible swallowing:  2 = Spontaneous and intermittent  Type of nipple:  2 = Everted at rest and after stimulation  Comfort (Breast/Nipple):  2 = Soft / non-tender  Hold (Positioning):  1 = Assistance needed to correctly position infant at breast and maintain latch  LATCH score:  9  Attached assessment:  Deep  Lips flanged:  Yes.    Lips untucked:  Yes.    Suck assessment:  Nutritive  Tools:  Comfort gels Instructed on use and cleaning of tool:  Yes.    Pre-feed weight:  3706 g   Post-feed weight:  3762 g  Amount transferred:  56 ml    Additional Feeding Assessment -   Infant's oral  assessment:  WNL upper lip tie noted, along with posterior tongue tie  Positioning:  Football Left breast  LATCH documentation:  Latch:  2 = Grasps breast easily, tongue down, lips flanged, rhythmical sucking.  Audible swallowing:  2 = Spontaneous and intermittent  Type of nipple:  2 = Everted at rest and after stimulation  Comfort (Breast/Nipple):  2 = Soft / non-tender  Hold (Positioning):  1 = Assistance needed to correctly position infant at breast and maintain latch  LATCH score:  9  Attached assessment:  Deep  Lips flanged:  Yes.    Lips untucked:  Yes.    Suck assessment:  Displays both  Tools:  Used nipple shield at home Instructed on use and cleaning of tool:  Yes.    Pre-feed weight:  3738 g   Post-feed weight:  3762 g Amount transferred: 24 ml   Total amount transferred:  80 ml Total supplement given:  0 ml

## 2015-02-11 MED FILL — NORETHINDRONE 0.35 MG TAB: 0.35 | 84 days supply | Qty: 84 | Fill #0

## 2015-05-09 MED FILL — NORETHINDRONE 0.35 MG TAB: 0.35 | 84 days supply | Qty: 84 | Fill #1

## 2015-07-29 DIAGNOSIS — H9012 Conductive hearing loss, unilateral, left ear, with unrestricted hearing on the contralateral side: Secondary | ICD-10-CM | POA: Diagnosis not present

## 2015-07-29 DIAGNOSIS — H7202 Central perforation of tympanic membrane, left ear: Secondary | ICD-10-CM | POA: Diagnosis not present

## 2015-07-29 DIAGNOSIS — H73001 Acute myringitis, right ear: Secondary | ICD-10-CM | POA: Diagnosis not present

## 2015-07-30 MED FILL — NORETHINDRONE 0.35 MG TAB: 0.35 | 84 days supply | Qty: 84 | Fill #2

## 2015-09-14 MED FILL — AMOX-CLAV 500-125 MG TABLET: 500-125 | 10 days supply | Qty: 21 | Fill #0

## 2015-10-25 MED FILL — NORETHINDRONE 0.35 MG TAB: 0.35 | 28 days supply | Qty: 28 | Fill #3

## 2015-11-18 MED FILL — LARIN FE 1.5-30 TABLET: 1.5-30 | 28 days supply | Qty: 28 | Fill #0

## 2015-12-06 DIAGNOSIS — Z01419 Encounter for gynecological examination (general) (routine) without abnormal findings: Secondary | ICD-10-CM | POA: Diagnosis not present

## 2015-12-06 DIAGNOSIS — Z681 Body mass index (BMI) 19 or less, adult: Secondary | ICD-10-CM | POA: Diagnosis not present

## 2015-12-06 MED FILL — LARIN FE 1.5-30 TABLET: 1.5-30 | 28 days supply | Qty: 28 | Fill #0

## 2015-12-06 MED FILL — CLINDAMYCIN PHOSP 1% LOTION: 1 | 30 days supply | Qty: 60 | Fill #0

## 2015-12-07 MED FILL — metroNIDAZOLE 500 MG TABS: 500 | 7 days supply | Qty: 14 | Fill #0

## 2016-01-11 MED FILL — LARIN FE 1.5-30 TABLET: 1.5-30 | 28 days supply | Qty: 28 | Fill #1

## 2016-02-11 MED FILL — LARIN FE 1.5-30 TABLET: 1.5-30 | 28 days supply | Qty: 28 | Fill #2

## 2016-03-06 MED FILL — NORETHINDRONE 0.35 MG TAB: 0.35 | 28 days supply | Qty: 28 | Fill #0

## 2016-05-04 MED FILL — VANDAZOLE VAGINAL 0.75% GEL: 0.75 | 5 days supply | Qty: 70 | Fill #0

## 2016-05-16 MED FILL — NYSTATIN-TRIAMCINOLONE CRM: 100000-0.1 | 7 days supply | Qty: 30 | Fill #0

## 2016-05-22 ENCOUNTER — Encounter (HOSPITAL_COMMUNITY): Payer: Self-pay | Admitting: Family Medicine

## 2016-05-22 DIAGNOSIS — Z5321 Procedure and treatment not carried out due to patient leaving prior to being seen by health care provider: Secondary | ICD-10-CM | POA: Diagnosis not present

## 2016-05-22 DIAGNOSIS — R1030 Lower abdominal pain, unspecified: Secondary | ICD-10-CM | POA: Diagnosis not present

## 2016-05-22 LAB — CBC
HEMATOCRIT: 43.7 % (ref 36.0–46.0)
Hemoglobin: 14.9 g/dL (ref 12.0–15.0)
MCH: 30.1 pg (ref 26.0–34.0)
MCHC: 34.1 g/dL (ref 30.0–36.0)
MCV: 88.3 fL (ref 78.0–100.0)
PLATELETS: 241 10*3/uL (ref 150–400)
RBC: 4.95 MIL/uL (ref 3.87–5.11)
RDW: 12.6 % (ref 11.5–15.5)
WBC: 8.1 10*3/uL (ref 4.0–10.5)

## 2016-05-22 LAB — URINALYSIS, ROUTINE W REFLEX MICROSCOPIC
BACTERIA UA: NONE SEEN
BILIRUBIN URINE: NEGATIVE
Glucose, UA: NEGATIVE mg/dL
Hgb urine dipstick: NEGATIVE
Ketones, ur: NEGATIVE mg/dL
Nitrite: NEGATIVE
PH: 6 (ref 5.0–8.0)
Protein, ur: NEGATIVE mg/dL
SPECIFIC GRAVITY, URINE: 1.015 (ref 1.005–1.030)

## 2016-05-22 LAB — COMPREHENSIVE METABOLIC PANEL
ALBUMIN: 4.1 g/dL (ref 3.5–5.0)
ALT: 16 U/L (ref 14–54)
AST: 17 U/L (ref 15–41)
Alkaline Phosphatase: 51 U/L (ref 38–126)
Anion gap: 6 (ref 5–15)
BUN: 12 mg/dL (ref 6–20)
CHLORIDE: 104 mmol/L (ref 101–111)
CO2: 28 mmol/L (ref 22–32)
CREATININE: 0.82 mg/dL (ref 0.44–1.00)
Calcium: 9 mg/dL (ref 8.9–10.3)
GFR calc Af Amer: >60 mL/min (ref >60)
GFR calc non Af Amer: >60 mL/min (ref >60)
GLUCOSE: 99 mg/dL (ref 65–99)
POTASSIUM: 3.9 mmol/L (ref 3.5–5.1)
SODIUM: 138 mmol/L (ref 135–145)
Total Bilirubin: 0.5 mg/dL (ref 0.3–1.2)
Total Protein: 6.9 g/dL (ref 6.5–8.1)

## 2016-05-22 LAB — LIPASE, BLOOD: LIPASE: 33 U/L (ref 11–51)

## 2016-05-22 NOTE — ED Triage Notes (Signed)
Patient is complaining of lower abd/pelvic pain that started around 17:00 while sitting at a desk. Pt describes pain as cramping with difference in severity at times. Pt denies nausea, vomiting, diarrhea, or fever. Reports she had urinary symptoms on Saturday and started taking old [place] medication she had at home. Reports vaginal bleeding that has been intermittent from having an IUD placed but this is not different.

## 2016-05-23 ENCOUNTER — Emergency Department (HOSPITAL_COMMUNITY)
Admission: EM | Admit: 2016-05-23 | Discharge: 2016-05-23 | Disposition: A | Discharge status: Home / Self Care | Payer: PRIVATE HEALTH INSURANCE | Attending: Emergency Medicine | Admitting: Emergency Medicine

## 2016-09-21 MED FILL — SULFAMETHOXAZOLE-TMP DS TAB: 800-160 | 7 days supply | Qty: 14 | Fill #0

## 2016-10-16 DIAGNOSIS — N390 Urinary tract infection, site not specified: Secondary | ICD-10-CM | POA: Diagnosis not present

## 2017-01-09 NOTE — L&D Delivery Note (Signed)
Delivery Note At 5:42 PM a viable female was delivered via Vaginal, Spontaneous (Presentation: LOA  ).  APGAR: 8, 9; weight pending.   Placenta status: routine , .  Cord: 3VC with the following complications: <58 second shoulder dystocia - resolved with [place].  Cord pH: sent  Anesthesia:  CLE Episiotomy: None Lacerations: 2nd degree Suture Repair: 2.0 3.0 vicryl Est. Blood Loss (mL): 300   It's a girl - "Lurline Idol"! Mom to postpartum.  Baby to Couplet care / Skin to Skin.  Tyson Dense 12/20/2017, 6:24 PM

## 2017-01-17 DIAGNOSIS — Z0000 Encounter for general adult medical examination without abnormal findings: Secondary | ICD-10-CM | POA: Diagnosis not present

## 2017-01-23 DIAGNOSIS — N390 Urinary tract infection, site not specified: Secondary | ICD-10-CM | POA: Diagnosis not present

## 2017-01-23 DIAGNOSIS — Z681 Body mass index (BMI) 19 or less, adult: Secondary | ICD-10-CM | POA: Diagnosis not present

## 2017-01-23 DIAGNOSIS — Z0000 Encounter for general adult medical examination without abnormal findings: Secondary | ICD-10-CM | POA: Diagnosis not present

## 2017-02-06 DIAGNOSIS — Z681 Body mass index (BMI) 19 or less, adult: Secondary | ICD-10-CM | POA: Diagnosis not present

## 2017-02-06 DIAGNOSIS — Z30432 Encounter for removal of intrauterine contraceptive device: Secondary | ICD-10-CM | POA: Diagnosis not present

## 2017-02-06 DIAGNOSIS — Z01419 Encounter for gynecological examination (general) (routine) without abnormal findings: Secondary | ICD-10-CM | POA: Diagnosis not present

## 2017-04-23 DIAGNOSIS — O3680X0 Pregnancy with inconclusive fetal viability, not applicable or unspecified: Secondary | ICD-10-CM | POA: Diagnosis not present

## 2017-04-23 DIAGNOSIS — N911 Secondary amenorrhea: Secondary | ICD-10-CM | POA: Diagnosis not present

## 2017-04-25 DIAGNOSIS — N912 Amenorrhea, unspecified: Secondary | ICD-10-CM | POA: Diagnosis not present

## 2017-05-08 DIAGNOSIS — N911 Secondary amenorrhea: Secondary | ICD-10-CM | POA: Diagnosis not present

## 2017-05-22 DIAGNOSIS — Z3685 Encounter for antenatal screening for Streptococcus B: Secondary | ICD-10-CM | POA: Diagnosis not present

## 2017-05-22 DIAGNOSIS — Z3481 Encounter for supervision of other normal pregnancy, first trimester: Secondary | ICD-10-CM | POA: Diagnosis not present

## 2017-05-22 LAB — OB RESULTS CONSOLE GC/CHLAMYDIA
Chlamydia: NEGATIVE
GC PROBE AMP, GENITAL: NEGATIVE

## 2017-05-22 LAB — OB RESULTS CONSOLE HIV ANTIBODY (ROUTINE TESTING): HIV: NONREACTIVE

## 2017-05-22 LAB — OB RESULTS CONSOLE HEPATITIS B SURFACE ANTIGEN: Hepatitis B Surface Ag: NEGATIVE

## 2017-05-22 LAB — OB RESULTS CONSOLE ANTIBODY SCREEN: Antibody Screen: NEGATIVE

## 2017-05-22 LAB — OB RESULTS CONSOLE RPR: RPR: NONREACTIVE

## 2017-05-22 LAB — OB RESULTS CONSOLE ABO/RH: RH Type: POSITIVE

## 2017-05-22 LAB — OB RESULTS CONSOLE RUBELLA ANTIBODY, IGM: Rubella: IMMUNE

## 2017-06-11 DIAGNOSIS — Z113 Encounter for screening for infections with a predominantly sexual mode of transmission: Secondary | ICD-10-CM | POA: Diagnosis not present

## 2017-06-11 DIAGNOSIS — Z3491 Encounter for supervision of normal pregnancy, unspecified, first trimester: Secondary | ICD-10-CM | POA: Diagnosis not present

## 2017-06-21 DIAGNOSIS — Z3682 Encounter for antenatal screening for nuchal translucency: Secondary | ICD-10-CM | POA: Diagnosis not present

## 2017-06-21 DIAGNOSIS — Z3A13 13 weeks gestation of pregnancy: Secondary | ICD-10-CM | POA: Diagnosis not present

## 2017-07-03 DIAGNOSIS — N390 Urinary tract infection, site not specified: Secondary | ICD-10-CM | POA: Diagnosis not present

## 2017-07-03 DIAGNOSIS — R82998 Other abnormal findings in urine: Secondary | ICD-10-CM | POA: Diagnosis not present

## 2017-07-03 MED FILL — NITROFURANTOIN MONO-MCR 100: 100 | 7 days supply | Qty: 14 | Fill #0

## 2017-08-02 DIAGNOSIS — Z3A19 19 weeks gestation of pregnancy: Secondary | ICD-10-CM | POA: Diagnosis not present

## 2017-08-02 DIAGNOSIS — Z363 Encounter for antenatal screening for malformations: Secondary | ICD-10-CM | POA: Diagnosis not present

## 2017-08-02 DIAGNOSIS — Z3492 Encounter for supervision of normal pregnancy, unspecified, second trimester: Secondary | ICD-10-CM | POA: Diagnosis not present

## 2017-10-04 DIAGNOSIS — Z3480 Encounter for supervision of other normal pregnancy, unspecified trimester: Secondary | ICD-10-CM | POA: Diagnosis not present

## 2017-10-12 DIAGNOSIS — O99810 Abnormal glucose complicating pregnancy: Secondary | ICD-10-CM | POA: Diagnosis not present

## 2017-11-05 DIAGNOSIS — H7202 Central perforation of tympanic membrane, left ear: Secondary | ICD-10-CM | POA: Diagnosis not present

## 2017-11-05 DIAGNOSIS — H9012 Conductive hearing loss, unilateral, left ear, with unrestricted hearing on the contralateral side: Secondary | ICD-10-CM | POA: Diagnosis not present

## 2017-11-05 DIAGNOSIS — Z331 Pregnant state, incidental: Secondary | ICD-10-CM | POA: Diagnosis not present

## 2017-11-13 MED FILL — AZITHROMYCIN 250 MG TABLET: 250 | 5 days supply | Qty: 6 | Fill #0

## 2017-11-21 ENCOUNTER — Emergency Department (HOSPITAL_COMMUNITY): Payer: 59

## 2017-11-21 ENCOUNTER — Other Ambulatory Visit: Payer: Self-pay

## 2017-11-21 ENCOUNTER — Encounter (HOSPITAL_COMMUNITY): Payer: Self-pay | Admitting: Emergency Medicine

## 2017-11-21 ENCOUNTER — Emergency Department (HOSPITAL_COMMUNITY)
Admission: EM | Admit: 2017-11-21 | Discharge: 2017-11-21 | Disposition: A | Discharge status: Home / Self Care | Payer: 59 | Attending: Emergency Medicine | Admitting: Emergency Medicine

## 2017-11-21 DIAGNOSIS — R4789 Other speech disturbances: Secondary | ICD-10-CM | POA: Insufficient documentation

## 2017-11-21 DIAGNOSIS — Z79899 Other long term (current) drug therapy: Secondary | ICD-10-CM | POA: Diagnosis not present

## 2017-11-21 DIAGNOSIS — O99353 Diseases of the nervous system complicating pregnancy, third trimester: Secondary | ICD-10-CM | POA: Diagnosis not present

## 2017-11-21 DIAGNOSIS — R000 Tachycardia, unspecified: Secondary | ICD-10-CM | POA: Diagnosis not present

## 2017-11-21 DIAGNOSIS — Z3A36 36 weeks gestation of pregnancy: Secondary | ICD-10-CM | POA: Diagnosis not present

## 2017-11-21 DIAGNOSIS — R609 Edema, unspecified: Secondary | ICD-10-CM | POA: Insufficient documentation

## 2017-11-21 DIAGNOSIS — H539 Unspecified visual disturbance: Secondary | ICD-10-CM

## 2017-11-21 DIAGNOSIS — O1203 Gestational edema, third trimester: Secondary | ICD-10-CM | POA: Diagnosis not present

## 2017-11-21 DIAGNOSIS — O26893 Other specified pregnancy related conditions, third trimester: Secondary | ICD-10-CM | POA: Diagnosis not present

## 2017-11-21 DIAGNOSIS — O9989 Other specified diseases and conditions complicating pregnancy, childbirth and the puerperium: Secondary | ICD-10-CM | POA: Diagnosis not present

## 2017-11-21 DIAGNOSIS — R600 Localized edema: Secondary | ICD-10-CM | POA: Diagnosis not present

## 2017-11-21 DIAGNOSIS — G43809 Other migraine, not intractable, without status migrainosus: Secondary | ICD-10-CM | POA: Insufficient documentation

## 2017-11-21 DIAGNOSIS — H5340 Unspecified visual field defects: Secondary | ICD-10-CM | POA: Diagnosis not present

## 2017-11-21 DIAGNOSIS — Z3A35 35 weeks gestation of pregnancy: Secondary | ICD-10-CM | POA: Diagnosis not present

## 2017-11-21 LAB — DIFFERENTIAL
Abs Immature Granulocytes: 0.05 10*3/uL (ref 0.00–0.07)
BASOS ABS: 0 10*3/uL (ref 0.0–0.1)
Basophils Relative: 0 %
EOS PCT: 0 %
Eosinophils Absolute: 0 10*3/uL (ref 0.0–0.5)
Immature Granulocytes: 0 %
LYMPHS PCT: 14 %
Lymphs Abs: 1.7 10*3/uL (ref 0.7–4.0)
Monocytes Absolute: 0.7 10*3/uL (ref 0.1–1.0)
Monocytes Relative: 6 %
NEUTROS ABS: 9.6 10*3/uL — AB (ref 1.7–7.7)
Neutrophils Relative %: 80 %

## 2017-11-21 LAB — URINALYSIS, ROUTINE W REFLEX MICROSCOPIC
Bilirubin Urine: NEGATIVE
GLUCOSE, UA: NEGATIVE mg/dL
Hgb urine dipstick: NEGATIVE
Ketones, ur: NEGATIVE mg/dL
LEUKOCYTES UA: NEGATIVE
Nitrite: NEGATIVE
PH: 7 (ref 5.0–8.0)
PROTEIN: NEGATIVE mg/dL
Specific Gravity, Urine: 1.006 (ref 1.005–1.030)

## 2017-11-21 LAB — I-STAT CHEM 8, ED
BUN: 6 mg/dL (ref 6–20)
CALCIUM ION: 1.14 mmol/L — AB (ref 1.15–1.40)
CHLORIDE: 104 mmol/L (ref 98–111)
CREATININE: 0.5 mg/dL (ref 0.44–1.00)
GLUCOSE: 88 mg/dL (ref 70–99)
HCT: 41 % (ref 36.0–46.0)
Hemoglobin: 13.9 g/dL (ref 12.0–15.0)
Potassium: 3.7 mmol/L (ref 3.5–5.1)
Sodium: 137 mmol/L (ref 135–145)
TCO2: 29 mmol/L (ref 22–32)

## 2017-11-21 LAB — COMPREHENSIVE METABOLIC PANEL
ALK PHOS: 94 U/L (ref 38–126)
ALT: 15 U/L (ref 0–44)
ANION GAP: 9 (ref 5–15)
AST: 17 U/L (ref 15–41)
Albumin: 3.2 g/dL — ABNORMAL LOW (ref 3.5–5.0)
BUN: 8 mg/dL (ref 6–20)
CALCIUM: 8.6 mg/dL — AB (ref 8.9–10.3)
CO2: 24 mmol/L (ref 22–32)
Chloride: 103 mmol/L (ref 98–111)
Creatinine, Ser: 0.54 mg/dL (ref 0.44–1.00)
GFR calc Af Amer: >60 mL/min (ref >60)
GFR calc non Af Amer: >60 mL/min (ref >60)
Glucose, Bld: 88 mg/dL (ref 70–99)
Potassium: 3.6 mmol/L (ref 3.5–5.1)
Sodium: 136 mmol/L (ref 135–145)
Total Bilirubin: 0.5 mg/dL (ref 0.3–1.2)
Total Protein: 6.6 g/dL (ref 6.5–8.1)

## 2017-11-21 LAB — ETHANOL: Alcohol, Ethyl (B): <10 mg/dL (ref <10)

## 2017-11-21 LAB — CBC
HEMATOCRIT: 44 % (ref 36.0–46.0)
HEMOGLOBIN: 14.5 g/dL (ref 12.0–15.0)
MCH: 31.5 pg (ref 26.0–34.0)
MCHC: 33 g/dL (ref 30.0–36.0)
MCV: 95.4 fL (ref 80.0–100.0)
Platelets: 183 10*3/uL (ref 150–400)
RBC: 4.61 MIL/uL (ref 3.87–5.11)
RDW: 13 % (ref 11.5–15.5)
WBC: 12.1 10*3/uL — AB (ref 4.0–10.5)
nRBC: 0 % (ref 0.0–0.2)

## 2017-11-21 LAB — RAPID URINE DRUG SCREEN, HOSP PERFORMED
Amphetamines: NOT DETECTED
Barbiturates: NOT DETECTED
Benzodiazepines: NOT DETECTED
COCAINE: NOT DETECTED
OPIATES: NOT DETECTED
Tetrahydrocannabinol: NOT DETECTED

## 2017-11-21 LAB — I-STAT TROPONIN, ED: Troponin i, poc: 0 ng/mL (ref 0.00–0.08)

## 2017-11-21 LAB — PROTIME-INR
INR: 0.99
Prothrombin Time: 13 seconds (ref 11.4–15.2)

## 2017-11-21 LAB — URIC ACID: Uric Acid, Serum: 3.6 mg/dL (ref 2.5–7.1)

## 2017-11-21 LAB — APTT: aPTT: 29 seconds (ref 24–36)

## 2017-11-21 MED ORDER — ACETAMINOPHEN 500 MG PO TABS
1000.0000 mg | ORAL_TABLET | Freq: Four times a day (QID) | ORAL | Status: DC | PRN
  Administered 2017-11-21: 1000 mg via ORAL
  Filled 2017-11-21: qty 2

## 2017-11-21 MED ORDER — SODIUM CHLORIDE 0.9 % IV BOLUS
1000.0000 mL | Freq: Once | INTRAVENOUS | Status: AC
  Administered 2017-11-21: 1000 mL via INTRAVENOUS

## 2017-11-21 NOTE — Progress Notes (Signed)
1940 Dr. Matthew Saras notified of normal [place] and of continued Category I FHR and non painful UCs.  Orders for labor precautions and follow up with office Friday received.  EDP notified.

## 2017-11-21 NOTE — ED Notes (Signed)
Patient transported to MRI 

## 2017-11-21 NOTE — Discharge Instructions (Addendum)
Please return for any problem. Follow up with your OB as instructed.

## 2017-11-21 NOTE — ED Triage Notes (Signed)
Patient states she felt a migraine coming on with loss of vision. That improved, however she has noticed dysphasia. She is [redacted] weeks pregnant.

## 2017-11-21 NOTE — ED Provider Notes (Signed)
Patient seen after sign out.  MRI brain is without evidence of acute abnormality.  Patient has been cleared by [facility] for discharge.  Patient now desires discharge.  She understands need for close follow-up.  Strict return precautions given and understood.   Valarie Merino, MD 11/21/17 [phone]

## 2017-11-21 NOTE — ED Notes (Signed)
This note also relates to the following rows which could not be included: Pulse Rate - Cannot attach notes to unvalidated device data SpO2 - Cannot attach notes to unvalidated device data  EFM off for U/S

## 2017-11-21 NOTE — ED Triage Notes (Signed)
I have phoned [facility]; and they are en route.

## 2017-11-21 NOTE — ED Provider Notes (Signed)
[place] DEPT Provider Note   CSN: [id number] Arrival date & time: 11/21/17  1414     History   Chief Complaint Chief Complaint  Patient presents with  . Aphasia    HPI Maria GUILBERT is a 32 y.o. female.  Pt presents to the ED today with visual field defects and difficulty finding words.  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  She noticed some swelling of her legs which started yesterday.  The pt has a hx of migraines and will get visual field defects with the migraine, but has not had a migraine in years.  She never got the headache today, but the vision is good now.  The pt said she could not find the correct word when she was talking with a patient.  All sx are gone now.  The pt is feeling the baby move and denies any vaginal bleeding.  Pt's obgyn is the [facility] for [employer].  Pt did not drink much today.     Past Medical History:  Diagnosis Date  . Headache   . IBS (irritable bowel syndrome)   . Vaginal delivery     Patient Active Problem List   Diagnosis Date Noted  . Post-dates pregnancy 10/15/2014    Past Surgical History:  Procedure Laterality Date  . RHINOPLASTY       OB History    Gravida  2   Para  1   Term  1   Preterm      AB      Living  1     SAB      TAB      Ectopic      Multiple  0   Live Births  1            Home Medications    Prior to Admission medications   Medication Sig Start Date End Date Taking? Authorizing Provider  erythromycin with ethanol (THERAMYCIN) 2 % external solution Apply 1 application topically daily.    [provider]  Prenatal Vit-Fe Fumarate-FA (PRENATAL MULTIVITAMIN) TABS tablet Take 1 tablet by mouth daily at 12 noon.    [provider]    Family History Family History  Problem Relation Age of Onset  . Migraines Sister   . Hypertension Maternal Grandmother   . Cancer Maternal Grandmother    breast  . Cancer Maternal Grandfather        lung  . Diabetes Paternal Grandmother     Social History Social History   Tobacco Use  . Smoking status: Never Smoker  . Smokeless tobacco: Never Used  Substance Use Topics  . Alcohol use: No  . Drug use: No     Allergies   Patient has no known allergies.   Review of Systems Review of Systems  Eyes: Positive for visual disturbance.  Neurological:       Aphasia  All other systems reviewed and are negative.    Physical Exam Updated Vital Signs BP 112/84   Pulse 92   Resp (!) 21   Ht 5\' 6"  (1.676 m)   Wt 68 kg   SpO2 100%   BMI 24.21 kg/m   Physical Exam  Constitutional: She is oriented to person, place, and time. She appears well-developed and well-nourished.  HENT:  Head: Normocephalic and atraumatic.  Right Ear: External ear normal.  Left Ear: External ear normal.  Nose: Nose normal.  Mouth/Throat: Oropharynx  is clear and moist.  Eyes: Pupils are equal, round, and reactive to light. Conjunctivae and EOM are normal.  Neck: Normal range of motion. Neck supple.  Cardiovascular: Normal rate, regular rhythm, normal heart sounds and intact distal pulses.  Pulmonary/Chest: Effort normal and breath sounds normal.  Abdominal: Soft. Bowel sounds are normal.  + gravid  Musculoskeletal: Normal range of motion.  Neurological: She is alert and oriented to person, place, and time.  Skin: Skin is warm. Capillary refill takes less than 2 seconds.  Psychiatric: She has a normal mood and affect. Her behavior is normal. Judgment and thought content normal.  Nursing note and vitals reviewed.    ED Treatments / Results  Labs (all labs ordered are listed, but only abnormal results are displayed) Labs Reviewed  CBC - Abnormal; Notable for the following components:      Result Value   WBC 12.1 (*)    All other components within normal limits  DIFFERENTIAL - Abnormal; Notable for the following components:   Neutro Abs 9.6 (*)     All other components within normal limits  COMPREHENSIVE METABOLIC PANEL - Abnormal; Notable for the following components:   Calcium 8.6 (*)    Albumin 3.2 (*)    All other components within normal limits  I-STAT CHEM 8, ED - Abnormal; Notable for the following components:   Calcium, Ion 1.14 (*)    All other components within normal limits  ETHANOL  PROTIME-INR  APTT  [facility] URINE DRUG SCREEN, HOSP PERFORMED  URINALYSIS, ROUTINE W REFLEX MICROSCOPIC  URIC ACID  I-STAT TROPONIN, ED    EKG EKG Interpretation  Date/Time:  Wednesday November 21 2017 15:01:59 EST Ventricular Rate:  103 PR Interval:    QRS Duration: 86 QT Interval:  342 QTC Calculation: 448 R Axis:   52 Text Interpretation:  Sinus tachycardia Borderline T abnormalities, anterior leads Confirmed by Isla Pence [phone]) on 11/21/2017 3:03:48 PM   Radiology No results found.  Procedures Procedures (including critical care time)  Medications Ordered in ED Medications  sodium chloride 0.9 % bolus 1,000 mL (1,000 mLs Intravenous New Bag/Given 11/21/17 1443)     Initial Impression / Assessment and Plan / ED Course  I have reviewed the triage vital signs and the nursing notes.  Pertinent labs & imaging results that were available during my care of the patient were reviewed by me and considered in my medical decision making (see chart for details).    [facility] called to ED.  OB nurse said baby looks ok.  She is waiting for results of MRI and urine and is in contact with OB doc.  Pt is now in MRI.  Ua has not yet been sent.  Pt signed out to Dr. Francia Greaves pending those results.  Final Clinical Impressions(s) / ED Diagnoses   Final diagnoses:  [redacted] weeks gestation of pregnancy  Peripheral edema    ED Discharge Orders    None       Isla Pence, MD 11/21/17 [phone]

## 2017-11-28 DIAGNOSIS — O3663X0 Maternal care for excessive fetal growth, third trimester, not applicable or unspecified: Secondary | ICD-10-CM | POA: Diagnosis not present

## 2017-11-28 DIAGNOSIS — Z3A35 35 weeks gestation of pregnancy: Secondary | ICD-10-CM | POA: Diagnosis not present

## 2017-11-28 DIAGNOSIS — Z3493 Encounter for supervision of normal pregnancy, unspecified, third trimester: Secondary | ICD-10-CM | POA: Diagnosis not present

## 2017-12-04 DIAGNOSIS — Z3480 Encounter for supervision of other normal pregnancy, unspecified trimester: Secondary | ICD-10-CM | POA: Diagnosis not present

## 2017-12-05 ENCOUNTER — Encounter (HOSPITAL_COMMUNITY): Payer: Self-pay | Admitting: *Deleted

## 2017-12-05 ENCOUNTER — Telehealth (HOSPITAL_COMMUNITY): Payer: Self-pay | Admitting: *Deleted

## 2017-12-05 NOTE — Telephone Encounter (Signed)
Preadmission screen  

## 2017-12-07 LAB — OB RESULTS CONSOLE GBS: GBS: POSITIVE

## 2017-12-11 ENCOUNTER — Encounter (HOSPITAL_COMMUNITY): Payer: Self-pay | Admitting: *Deleted

## 2017-12-11 ENCOUNTER — Telehealth (HOSPITAL_COMMUNITY): Payer: Self-pay | Admitting: *Deleted

## 2017-12-11 NOTE — Telephone Encounter (Signed)
Preadmission screen  

## 2017-12-12 ENCOUNTER — Ambulatory Visit (HOSPITAL_COMMUNITY): Admission: RE | Admit: 2017-12-12 | Payer: PRIVATE HEALTH INSURANCE | Source: Ambulatory Visit

## 2017-12-12 NOTE — Telephone Encounter (Signed)
Preadmission screen  

## 2017-12-19 NOTE — H&P (Signed)
Maria Schultz is a 32 y.o. female presenting for IOL> Pregnancy c/b polyhydramnios. Last [place] AFI 96%-ile and EFW 93%-ile. The fetal lie is unstable but vertex at lats exam. She is GBS pos w/out a PCN allergy. 1hr GTT was elevated but 3hr was WNL.They are expecting a female.   OB History    Gravida  2   Para  1   Term  1   Preterm      AB      Living  1     SAB      TAB      Ectopic      Multiple  0   Live Births  1          Past Medical History:  Diagnosis Date  . Headache   . IBS (irritable bowel syndrome)   . Vaginal delivery    Past Surgical History:  Procedure Laterality Date  . RHINOPLASTY     Family History: family history includes Cancer in her maternal aunt, maternal grandfather, and maternal grandmother; Diabetes in her paternal grandmother; Hypertension in her maternal grandmother and mother; Migraines in her sister. Social History:  reports that she has never smoked. She has never used smokeless tobacco. She reports that she does not drink alcohol or use drugs.     Maternal Diabetes: No Genetic Screening: Normal Maternal Ultrasounds/Referrals: Normal Fetal Ultrasounds or other Referrals:  Other:  polyhydramnios (AFI 96%-ile) Maternal Substance Abuse:  No Significant Maternal Medications:  None Significant Maternal Lab Results:  None Other Comments:  None  ROS History   unknown if currently breastfeeding. Exam Physical Exam  (from office) NAD, A&O NWOB Abd soft, nondistended, gravid  Prenatal labs: ABO, Rh: A/Positive/-- (05/14 0000) Antibody: n (05/14 0000) Rubella: Immune (05/14 0000) RPR: Nonreactive (05/14 0000)  HBsAg: Negative (05/14 0000)  HIV: Non-reactive (05/14 0000)  GBS:   positive, no PCN allergy  Assessment/Plan: 32 yo G2P1001 @ 39.0 wga presenting for IOL s/s polyhydramnios. Pregnancy otherwise uncomplicated. Last US showed AFI 96%ile and EFW 92%ile. Genetic testing was normal. She has a h/o prior NSVD of 8# infant.   - plan cytotec followed by pitocin when more favorable - plan controlled AROM when able. Risks of cord prolapse given polyhydramnios - GBS pos - PCN   Tyson Dense 12/19/2017, 12:57 PM

## 2017-12-20 ENCOUNTER — Observation Stay (HOSPITAL_COMMUNITY): Payer: 59 | Admitting: Anesthesiology

## 2017-12-20 ENCOUNTER — Other Ambulatory Visit: Payer: Self-pay

## 2017-12-20 ENCOUNTER — Inpatient Hospital Stay (HOSPITAL_COMMUNITY)
Admission: RE | Admit: 2017-12-20 | Discharge: 2017-12-21 | DRG: 807 | Disposition: A | Discharge status: Home / Self Care | Payer: 59 | Attending: Obstetrics and Gynecology | Admitting: Obstetrics and Gynecology

## 2017-12-20 ENCOUNTER — Encounter (HOSPITAL_COMMUNITY): Payer: Self-pay

## 2017-12-20 VITALS — BP 105/67 | HR 72 | Temp 97.8°F | Resp 18 | Ht 66.0 in | Wt 165.9 lb

## 2017-12-20 DIAGNOSIS — O403XX0 Polyhydramnios, third trimester, not applicable or unspecified: Secondary | ICD-10-CM | POA: Diagnosis present

## 2017-12-20 DIAGNOSIS — Z3490 Encounter for supervision of normal pregnancy, unspecified, unspecified trimester: Secondary | ICD-10-CM

## 2017-12-20 DIAGNOSIS — O99824 Streptococcus B carrier state complicating childbirth: Secondary | ICD-10-CM | POA: Diagnosis present

## 2017-12-20 DIAGNOSIS — Z3A39 39 weeks gestation of pregnancy: Secondary | ICD-10-CM | POA: Diagnosis not present

## 2017-12-20 LAB — TYPE AND SCREEN
ABO/RH(D): A POS
Antibody Screen: NEGATIVE

## 2017-12-20 LAB — CBC
HEMATOCRIT: 40.5 % (ref 36.0–46.0)
Hemoglobin: 13.4 g/dL (ref 12.0–15.0)
MCH: 30.7 pg (ref 26.0–34.0)
MCHC: 33.1 g/dL (ref 30.0–36.0)
MCV: 92.9 fL (ref 80.0–100.0)
Platelets: 184 10*3/uL (ref 150–400)
RBC: 4.36 MIL/uL (ref 3.87–5.11)
RDW: 13.9 % (ref 11.5–15.5)
WBC: 10.7 10*3/uL — ABNORMAL HIGH (ref 4.0–10.5)

## 2017-12-20 LAB — RPR: RPR Ser Ql: NONREACTIVE

## 2017-12-20 MED ORDER — WITCH HAZEL-GLYCERIN EX PADS: 1.0000 "application " | MEDICATED_PAD | CUTANEOUS | Status: DC | PRN

## 2017-12-20 MED ORDER — EPHEDRINE 5 MG/ML INJ
10.0000 mg | INTRAVENOUS | Status: DC | PRN
  Filled 2017-12-20: qty 2

## 2017-12-20 MED ORDER — BENZOCAINE-MENTHOL 20-0.5 % EX AERO
1.0000 "application " | INHALATION_SPRAY | CUTANEOUS | Status: DC | PRN
  Administered 2017-12-21: 1 via TOPICAL
  Filled 2017-12-20: qty 56

## 2017-12-20 MED ORDER — FENTANYL 2.5 MCG/ML BUPIVACAINE 1/10 % EPIDURAL INFUSION (WH - ANES)
14.0000 mL/h | INTRAMUSCULAR | Status: DC | PRN
  Administered 2017-12-20 (×2): 14 mL/h via EPIDURAL
  Filled 2017-12-20 (×2): qty 100

## 2017-12-20 MED ORDER — SENNOSIDES-DOCUSATE SODIUM 8.6-50 MG PO TABS
2.0000 | ORAL_TABLET | ORAL | Status: DC
  Administered 2017-12-21: 2 via ORAL
  Filled 2017-12-20: qty 2

## 2017-12-20 MED ORDER — ACETAMINOPHEN 325 MG PO TABS: 650.0000 mg | ORAL_TABLET | ORAL | Status: DC | PRN

## 2017-12-20 MED ORDER — PRENATAL MULTIVITAMIN CH: 1.0000 | ORAL_TABLET | Freq: Every day | ORAL | Status: DC

## 2017-12-20 MED ORDER — PRENATAL MULTIVITAMIN CH
1.0000 | ORAL_TABLET | Freq: Every day | ORAL | Status: DC
  Administered 2017-12-21: 1 via ORAL
  Filled 2017-12-20: qty 1

## 2017-12-20 MED ORDER — SOD CITRATE-CITRIC ACID 500-334 MG/5ML PO SOLN: 30.0000 mL | ORAL | Status: DC | PRN

## 2017-12-20 MED ORDER — SODIUM CHLORIDE 0.9 % IV SOLN
5.0000 10*6.[IU] | Freq: Once | INTRAVENOUS | Status: AC
  Administered 2017-12-20: 5 10*6.[IU] via INTRAVENOUS
  Filled 2017-12-20: qty 5

## 2017-12-20 MED ORDER — DIPHENHYDRAMINE HCL 25 MG PO CAPS: 25.0000 mg | ORAL_CAPSULE | Freq: Four times a day (QID) | ORAL | Status: DC | PRN

## 2017-12-20 MED ORDER — ONDANSETRON HCL 4 MG PO TABS: 4.0000 mg | ORAL_TABLET | ORAL | Status: DC | PRN

## 2017-12-20 MED ORDER — LIDOCAINE HCL (PF) 1 % IJ SOLN
30.0000 mL | INTRAMUSCULAR | Status: DC | PRN
  Filled 2017-12-20: qty 30

## 2017-12-20 MED ORDER — OXYTOCIN 40 UNITS IN LACTATED RINGERS INFUSION - SIMPLE MED: 2.5000 [IU]/h | INTRAVENOUS | Status: DC

## 2017-12-20 MED ORDER — ONDANSETRON HCL 4 MG/2ML IJ SOLN: 4.0000 mg | INTRAMUSCULAR | Status: DC | PRN

## 2017-12-20 MED ORDER — OXYCODONE HCL 5 MG PO TABS: 5.0000 mg | ORAL_TABLET | ORAL | Status: DC | PRN

## 2017-12-20 MED ORDER — DIBUCAINE 1 % RE OINT: 1.0000 "application " | TOPICAL_OINTMENT | RECTAL | Status: DC | PRN

## 2017-12-20 MED ORDER — SIMETHICONE 80 MG PO CHEW: 80.0000 mg | CHEWABLE_TABLET | ORAL | Status: DC | PRN

## 2017-12-20 MED ORDER — ZOLPIDEM TARTRATE 5 MG PO TABS: 5.0000 mg | ORAL_TABLET | Freq: Every evening | ORAL | Status: DC | PRN

## 2017-12-20 MED ORDER — BUTORPHANOL TARTRATE 1 MG/ML IJ SOLN: 1.0000 mg | INTRAMUSCULAR | Status: DC | PRN

## 2017-12-20 MED ORDER — OXYCODONE HCL 5 MG PO TABS: 10.0000 mg | ORAL_TABLET | ORAL | Status: DC | PRN

## 2017-12-20 MED ORDER — LACTATED RINGERS IV SOLN
INTRAVENOUS | Status: DC
  Administered 2017-12-20 (×3): via INTRAVENOUS

## 2017-12-20 MED ORDER — TETANUS-DIPHTH-ACELL PERTUSSIS 5-2.5-18.5 LF-MCG/0.5 IM SUSP: 0.5000 mL | Freq: Once | INTRAMUSCULAR | Status: DC

## 2017-12-20 MED ORDER — TERBUTALINE SULFATE 1 MG/ML IJ SOLN
0.2500 mg | Freq: Once | INTRAMUSCULAR | Status: DC | PRN
  Filled 2017-12-20: qty 1

## 2017-12-20 MED ORDER — PENICILLIN G 3 MILLION UNITS IVPB - SIMPLE MED
3.0000 10*6.[IU] | INTRAVENOUS | Status: DC
  Administered 2017-12-20 (×4): 3 10*6.[IU] via INTRAVENOUS
  Filled 2017-12-20 (×6): qty 100

## 2017-12-20 MED ORDER — LACTATED RINGERS IV SOLN: 500.0000 mL | INTRAVENOUS | Status: DC | PRN

## 2017-12-20 MED ORDER — PHENYLEPHRINE 40 MCG/ML (10ML) SYRINGE FOR IV PUSH (FOR BLOOD PRESSURE SUPPORT)
80.0000 ug | PREFILLED_SYRINGE | INTRAVENOUS | Status: DC | PRN
  Filled 2017-12-20 (×2): qty 10

## 2017-12-20 MED ORDER — MISOPROSTOL 25 MCG QUARTER TABLET
25.0000 ug | ORAL_TABLET | ORAL | Status: DC | PRN
  Administered 2017-12-20 (×2): 25 ug via VAGINAL
  Filled 2017-12-20 (×3): qty 1

## 2017-12-20 MED ORDER — DIPHENHYDRAMINE HCL 50 MG/ML IJ SOLN: 12.5000 mg | INTRAMUSCULAR | Status: DC | PRN

## 2017-12-20 MED ORDER — IBUPROFEN 600 MG PO TABS
600.0000 mg | ORAL_TABLET | Freq: Four times a day (QID) | ORAL | Status: DC
  Administered 2017-12-21 (×4): 600 mg via ORAL
  Filled 2017-12-20 (×4): qty 1

## 2017-12-20 MED ORDER — OXYCODONE-ACETAMINOPHEN 5-325 MG PO TABS: 2.0000 | ORAL_TABLET | ORAL | Status: DC | PRN

## 2017-12-20 MED ORDER — COCONUT OIL OIL
1.0000 "application " | TOPICAL_OIL | Status: DC | PRN
  Administered 2017-12-21: 1 via TOPICAL
  Filled 2017-12-20: qty 120

## 2017-12-20 MED ORDER — ONDANSETRON HCL 4 MG/2ML IJ SOLN: 4.0000 mg | Freq: Four times a day (QID) | INTRAMUSCULAR | Status: DC | PRN

## 2017-12-20 MED ORDER — OXYTOCIN BOLUS FROM INFUSION
500.0000 mL | Freq: Once | INTRAVENOUS | Status: AC
  Administered 2017-12-20: 500 mL via INTRAVENOUS

## 2017-12-20 MED ORDER — LACTATED RINGERS IV SOLN: 500.0000 mL | Freq: Once | INTRAVENOUS | Status: DC

## 2017-12-20 MED ORDER — OXYTOCIN 40 UNITS IN LACTATED RINGERS INFUSION - SIMPLE MED
1.0000 m[IU]/min | INTRAVENOUS | Status: DC
  Administered 2017-12-20: 2 m[IU]/min via INTRAVENOUS
  Filled 2017-12-20: qty 1000

## 2017-12-20 MED ORDER — LIDOCAINE HCL (PF) 1 % IJ SOLN
INTRAMUSCULAR | Status: DC | PRN
  Administered 2017-12-20 (×2): 4 mL via EPIDURAL

## 2017-12-20 MED ORDER — OXYCODONE-ACETAMINOPHEN 5-325 MG PO TABS: 1.0000 | ORAL_TABLET | ORAL | Status: DC | PRN

## 2017-12-20 MED ORDER — ACETAMINOPHEN 325 MG PO TABS
650.0000 mg | ORAL_TABLET | ORAL | Status: DC | PRN
  Administered 2017-12-21: 650 mg via ORAL
  Filled 2017-12-20: qty 2

## 2017-12-20 NOTE — Anesthesia Procedure Notes (Signed)
Epidural Patient location during procedure: OB Start time: 12/20/2017 9:00 AM  Staffing Anesthesiologist: Josephine Igo, MD Performed: anesthesiologist   Preanesthetic Checklist Completed: patient identified, site marked, surgical consent, pre-op evaluation, timeout performed, IV checked, risks and benefits discussed and monitors and equipment checked  Epidural Patient position: sitting Prep: site prepped and draped and DuraPrep Patient monitoring: continuous pulse ox and blood pressure Approach: midline Location: L3-L4 Injection technique: LOR air  Needle:  Needle type: Tuohy  Needle gauge: 17 G Needle length: 9 cm and 9 Needle insertion depth: 4 cm Catheter type: closed end flexible Catheter size: 19 Gauge Catheter at skin depth: 9 cm Test dose: negative and Other  Assessment Events: blood not aspirated, injection not painful, no injection resistance, negative IV test and no paresthesia  Additional Notes Patient identified. Risks and benefits discussed including failed block, incomplete  Pain control, post dural puncture headache, nerve damage, paralysis, blood pressure Changes, nausea, vomiting, reactions to medications-both toxic and allergic and post Partum back pain. All questions were answered. Patient expressed understanding and wished to proceed. Sterile technique was used throughout procedure. Epidural site was Dressed with sterile barrier dressing. No paresthesias, signs of intravascular injection Or signs of intrathecal spread were encountered.  Patient was more comfortable after the epidural was dosed. Please see RN's note for documentation of vital signs and FHR which are stable.

## 2017-12-20 NOTE — Progress Notes (Signed)
Comf w/cle AFVSS SVE 6/60/-2, OT Manual rotation to OA

## 2017-12-20 NOTE — Anesthesia Preprocedure Evaluation (Signed)
Anesthesia Evaluation  Patient identified by MRN, date of birth, ID band Patient awake    Reviewed: Allergy & Precautions, H&P , Patient's Chart, lab work & pertinent test results  Airway Mallampati: II  TM Distance: >3 FB Neck ROM: full    Dental no notable dental hx. (+) Teeth Intact   Pulmonary neg pulmonary ROS,    Pulmonary exam normal breath sounds clear to auscultation       Cardiovascular negative cardio ROS Normal cardiovascular exam Rhythm:regular Rate:Normal     Neuro/Psych negative neurological ROS  negative psych ROS   GI/Hepatic negative GI ROS, Neg liver ROS,   Endo/Other  negative endocrine ROS  Renal/GU negative Renal ROS  negative genitourinary   Musculoskeletal   Abdominal   Peds  Hematology negative hematology ROS (+)   Anesthesia Other Findings   Reproductive/Obstetrics (+) Pregnancy                             Anesthesia Physical Anesthesia Plan  ASA: II  Anesthesia Plan: Epidural   Post-op Pain Management:    Induction:   PONV Risk Score and Plan:   Airway Management Planned:   Additional Equipment:   Intra-op Plan:   Post-operative Plan:   Informed Consent: I have reviewed the patients History and Physical, chart, labs and discussed the procedure including the risks, benefits and alternatives for the proposed anesthesia with the patient or authorized representative who has indicated his/her understanding and acceptance.     Plan Discussed with: Anesthesiologist  Anesthesia Plan Comments:         Anesthesia Quick Evaluation

## 2017-12-20 NOTE — Progress Notes (Signed)
Triton not available during pt delivery due to all machines being used for other deliveries.  EBL of 368ml given by Dr. Royston Sinner.  Will continue to monitor.   Krystal Eaton RN

## 2017-12-20 NOTE — Progress Notes (Signed)
Feeling painful contractions, wants CLE.   AFVSS Vertex by BSUS + polyhydramnios AROM done in a slow, controlled fashion with head well applied. Clear fluid noted. SVE 2.5/long/-2 after AROM. Confirmed vertex by BSUS and sutures on exam after AROM  S/p cytotec x 2. Cervix now favorable and s/p AROM. Initiate pitocin. GBS pos - cont PCN.    Arty Baumgartner MD

## 2017-12-20 NOTE — Progress Notes (Signed)
C/c/0 to +1 Start to push

## 2017-12-20 NOTE — Anesthesia Pain Management Evaluation Note (Signed)
  CRNA Pain Management Visit Note  Patient: Maria Schultz, 32 y.o., female  "Hello I am a member of the anesthesia team at [facility]. We have an anesthesia team available at all times to provide care throughout the hospital, including epidural management and anesthesia for C-section. I don't know your plan for the delivery whether it a natural birth, water birth, IV sedation, nitrous supplementation, doula or epidural, but we want to meet your pain goals."   1.Was your pain managed to your expectations on prior hospitalizations?   Yes   2.What is your expectation for pain management during this hospitalization?     Epidural  3.How can we help you reach that goal? epidural  Record the patient's initial score and the patient's pain goal.   Pain: 0  Pain Goal: 7 [facility] wants you to be able to say your pain was always managed very well.  Bufford Spikes 12/20/2017

## 2017-12-21 LAB — CBC
HCT: 39.8 % (ref 36.0–46.0)
Hemoglobin: 13.3 g/dL (ref 12.0–15.0)
MCH: 31.6 pg (ref 26.0–34.0)
MCHC: 33.4 g/dL (ref 30.0–36.0)
MCV: 94.5 fL (ref 80.0–100.0)
Platelets: 179 10*3/uL (ref 150–400)
RBC: 4.21 MIL/uL (ref 3.87–5.11)
RDW: 13.7 % (ref 11.5–15.5)
WBC: 16.4 10*3/uL — ABNORMAL HIGH (ref 4.0–10.5)
nRBC: 0 % (ref 0.0–0.2)

## 2017-12-21 NOTE — Discharge Summary (Signed)
Obstetric Discharge Summary Reason for Admission: induction of labor Prenatal Procedures: none Intrapartum Procedures: spontaneous vaginal delivery Postpartum Procedures: none Complications-Operative and Postpartum: 2nd degree perineal laceration Hemoglobin  Date Value Ref Range Status  12/21/2017 13.3 12.0 - 15.0 g/dL Final   HCT  Date Value Ref Range Status  12/21/2017 39.8 36.0 - 46.0 % Final    Physical Exam:  General: alert, cooperative and appears stated age 15: appropriate Uterine Fundus: firm Incision: healing well, no significant drainage, no dehiscence, no significant erythema DVT Evaluation: No evidence of DVT seen on physical exam.  Discharge Diagnoses: Term Pregnancy-delivered  Discharge Information: Date: 12/21/2017 Activity: pelvic rest Diet: routine Medications: Ibuprofen Condition: improved Instructions: refer to practice specific booklet Discharge to: home   Newborn Data: Live born female  Birth Weight: 8 lb 13.5 oz (4010 g) APGAR: 8, 9  Newborn Delivery   Birth date/time:  12/20/2017 17:42:00 Delivery type:  Vaginal, Spontaneous     Home with mother.  Maria Schultz 12/21/2017, 9:22 AM

## 2017-12-21 NOTE — Lactation Note (Signed)
This note was copied from a baby's chart. Lactation Consultation Note Baby sleepy at times. Mom states baby has spit up large clear fluid.  Mom states baby doing better than her 1st child but sometimes isn't obtaining deep latch. Discussed positioning, support, comfort, breast massage, STS, I&O, shells, pumping, supply and demand.  Baby sleeping. Mom has everted nipples. Slight areola edema. Encouraged finger stimulation or reverse pressure. Shells given to assist in everting nipples and lessening edema. Hand pump given. Mom had a lot of trouble BF her 1st child, ending up pumping and bottle feeding for 9 months, then supplemented until 32 months old. Newborn behavior discussed. Mom stated she would like LC to see latch. Gave mom pillows for next feeding. Encouraged mom to call for next feeding. Mom is [employer]. Mom picked out [employer]. Insurance card copied. Mom encouraged to feed baby 8-12 times/24 hours and with feeding cues.  [place] brochure given w/resources, support groups and [place] services.  Patient Name: Maria Schultz Date: 12/21/2017 Reason for consult: Initial assessment   Maternal Data Has patient been taught Hand Expression?: Yes Does the patient have breastfeeding experience prior to this delivery?: Yes  Feeding Feeding Type: Breast Fed  LATCH Score       Type of Nipple: Everted at rest and after stimulation  Comfort (Breast/Nipple): Filling, red/small blisters or bruises, mild/mod discomfort        Interventions Interventions: Breast feeding basics reviewed;Support pillows;Position options;Breast massage;Hand express;Shells;Reverse pressure;Breast compression;Hand pump  Lactation Tools Discussed/Used Tools: Shells;Pump Shell Type: Inverted Breast pump type: Manual Pump Review: Setup, frequency, and cleaning;Milk Storage Initiated by:: Allayne Stack RN IBCLC Date initiated:: 12/21/17   Consult Status Consult Status: Follow-up Date:  12/21/17 Follow-up type: In-patient    Maria Schultz, Elta Guadeloupe 12/21/2017, 8:04 AM

## 2017-12-21 NOTE — Anesthesia Postprocedure Evaluation (Signed)
Anesthesia Post Note  Patient: Maria Schultz Feeling  Procedure(s) Performed: AN AD HOC LABOR EPIDURAL     Patient location during evaluation: Mother Baby Anesthesia Type: Epidural Level of consciousness: awake and alert, oriented and patient cooperative Pain management: pain level controlled Vital Signs Assessment: post-procedure vital signs reviewed and stable Respiratory status: spontaneous breathing Cardiovascular status: stable Postop Assessment: no headache, epidural receding, patient able to bend at knees and no signs of nausea or vomiting Anesthetic complications: no Comments: Pain score 1.     Last Vitals:  Vitals:   12/21/17 0044 12/21/17 0502  BP: 103/65 114/85  Pulse: 70 71  Resp: 16 16  Temp: 36.7 C 36.6 C  SpO2:      Last Pain:  Vitals:   12/21/17 0503  TempSrc:   PainSc: 4    Pain Goal:                 [facility]

## 2017-12-21 NOTE — Lactation Note (Signed)
This note was copied from a baby's chart. Lactation Consultation Note  Patient Name: Maria Schultz [id number] Date: 12/21/2017 Reason for consult: Follow-up assessment  Infant is nursing very well. Sometimes you can hear "clicking" during the feeding, but that corresponds with her swallows (cervical auscultation done to verify this). In addition, no cheek dimpling is noted & Mom feels no pain when this clicking is heard.  Mom had an abundant supply with her 1st child (now 49 yo). She reports having been able to pump 10 oz/session.   Mom reports that her nipples shape is not rounded when "Lurline Idol" releases latch. Specifics of an asymmetric latch shown via [employer] to aid in better latches.   Parents appreciative of information.   Matthias Hughs [facility] 12/21/2017, 6:21 PM

## 2018-01-18 DIAGNOSIS — Z0000 Encounter for general adult medical examination without abnormal findings: Secondary | ICD-10-CM | POA: Diagnosis not present

## 2018-01-24 DIAGNOSIS — Z6821 Body mass index (BMI) 21.0-21.9, adult: Secondary | ICD-10-CM | POA: Diagnosis not present

## 2018-01-24 DIAGNOSIS — Z0000 Encounter for general adult medical examination without abnormal findings: Secondary | ICD-10-CM | POA: Diagnosis not present

## 2018-01-25 DIAGNOSIS — J029 Acute pharyngitis, unspecified: Secondary | ICD-10-CM | POA: Diagnosis not present

## 2018-01-25 DIAGNOSIS — Z6821 Body mass index (BMI) 21.0-21.9, adult: Secondary | ICD-10-CM | POA: Diagnosis not present

## 2018-01-30 DIAGNOSIS — Z1389 Encounter for screening for other disorder: Secondary | ICD-10-CM | POA: Diagnosis not present

## 2018-02-07 DIAGNOSIS — H9012 Conductive hearing loss, unilateral, left ear, with unrestricted hearing on the contralateral side: Secondary | ICD-10-CM | POA: Diagnosis not present

## 2018-02-07 DIAGNOSIS — H7202 Central perforation of tympanic membrane, left ear: Secondary | ICD-10-CM | POA: Diagnosis not present

## 2018-02-19 DIAGNOSIS — Z3202 Encounter for pregnancy test, result negative: Secondary | ICD-10-CM | POA: Diagnosis not present

## 2018-02-19 DIAGNOSIS — Z30430 Encounter for insertion of intrauterine contraceptive device: Secondary | ICD-10-CM | POA: Diagnosis not present

## 2018-04-26 DIAGNOSIS — Z30431 Encounter for routine checking of intrauterine contraceptive device: Secondary | ICD-10-CM | POA: Diagnosis not present

## 2018-06-18 MED FILL — TRANEXAMIC ACID 650 MG TAB: 650 | 5 days supply | Qty: 30 | Fill #0

## 2018-07-05 ENCOUNTER — Other Ambulatory Visit: Payer: Self-pay | Admitting: Physician Assistant

## 2018-07-05 DIAGNOSIS — D485 Neoplasm of uncertain behavior of skin: Secondary | ICD-10-CM | POA: Diagnosis not present

## 2018-07-05 DIAGNOSIS — D2271 Melanocytic nevi of right lower limb, including hip: Secondary | ICD-10-CM | POA: Diagnosis not present

## 2018-07-26 DIAGNOSIS — F411 Generalized anxiety disorder: Secondary | ICD-10-CM | POA: Diagnosis not present

## 2018-07-26 DIAGNOSIS — G4700 Insomnia, unspecified: Secondary | ICD-10-CM | POA: Diagnosis not present

## 2018-07-26 DIAGNOSIS — F341 Dysthymic disorder: Secondary | ICD-10-CM | POA: Diagnosis not present

## 2018-07-26 MED FILL — SERTRALINE HCL 25 MG TABLET: 25 | 45 days supply | Qty: 90 | Fill #0

## 2018-07-29 DIAGNOSIS — G4700 Insomnia, unspecified: Secondary | ICD-10-CM | POA: Diagnosis not present

## 2018-07-29 DIAGNOSIS — Z719 Counseling, unspecified: Secondary | ICD-10-CM | POA: Diagnosis not present

## 2018-07-29 DIAGNOSIS — J309 Allergic rhinitis, unspecified: Secondary | ICD-10-CM | POA: Diagnosis not present

## 2018-08-23 DIAGNOSIS — G4700 Insomnia, unspecified: Secondary | ICD-10-CM | POA: Diagnosis not present

## 2018-08-23 DIAGNOSIS — F341 Dysthymic disorder: Secondary | ICD-10-CM | POA: Diagnosis not present

## 2018-08-23 DIAGNOSIS — F411 Generalized anxiety disorder: Secondary | ICD-10-CM | POA: Diagnosis not present

## 2018-11-04 MED FILL — SERTRALINE HCL 25 MG TABLET: 25 | 15 days supply | Qty: 30 | Fill #0

## 2018-11-07 ENCOUNTER — Telehealth: Payer: Self-pay | Admitting: Nurse Practitioner

## 2018-11-07 NOTE — Telephone Encounter (Signed)
error 

## 2018-11-13 DIAGNOSIS — F341 Dysthymic disorder: Secondary | ICD-10-CM | POA: Diagnosis not present

## 2018-11-13 DIAGNOSIS — F411 Generalized anxiety disorder: Secondary | ICD-10-CM | POA: Diagnosis not present

## 2018-11-13 DIAGNOSIS — G4700 Insomnia, unspecified: Secondary | ICD-10-CM | POA: Diagnosis not present

## 2018-11-13 MED FILL — SERTRALINE HCL 25 MG TABLET: 25 | 15 days supply | Qty: 30 | Fill #0

## 2018-11-29 MED FILL — NITROFURANTOIN MCR 100 MG C: 100 | 30 days supply | Qty: 30 | Fill #0

## 2018-12-13 MED FILL — SERTRALINE HCL 25 MG TABLET: 25 | 60 days supply | Qty: 120 | Fill #0

## 2019-02-03 DIAGNOSIS — Z0184 Encounter for antibody response examination: Secondary | ICD-10-CM | POA: Diagnosis not present

## 2019-02-14 DIAGNOSIS — Z20828 Contact with and (suspected) exposure to other viral communicable diseases: Secondary | ICD-10-CM | POA: Diagnosis not present

## 2019-02-18 DIAGNOSIS — Z0000 Encounter for general adult medical examination without abnormal findings: Secondary | ICD-10-CM | POA: Diagnosis not present

## 2019-02-18 DIAGNOSIS — Z6821 Body mass index (BMI) 21.0-21.9, adult: Secondary | ICD-10-CM | POA: Diagnosis not present

## 2019-02-18 MED FILL — NITROFURANTOIN MCR 100 MG C: 100 | 30 days supply | Qty: 30 | Fill #0

## 2019-04-02 MED FILL — SERTRALINE HCL 25 MG TABLET: 25 | 72 days supply | Qty: 145 | Fill #1

## 2019-05-10 DIAGNOSIS — Z20828 Contact with and (suspected) exposure to other viral communicable diseases: Secondary | ICD-10-CM | POA: Diagnosis not present

## 2019-05-10 DIAGNOSIS — Z03818 Encounter for observation for suspected exposure to other biological agents ruled out: Secondary | ICD-10-CM | POA: Diagnosis not present

## 2019-05-16 ENCOUNTER — Other Ambulatory Visit (HOSPITAL_COMMUNITY): Payer: Self-pay | Admitting: Family Medicine

## 2019-05-16 DIAGNOSIS — F411 Generalized anxiety disorder: Secondary | ICD-10-CM | POA: Diagnosis not present

## 2019-05-16 DIAGNOSIS — G4700 Insomnia, unspecified: Secondary | ICD-10-CM | POA: Diagnosis not present

## 2019-05-16 DIAGNOSIS — F341 Dysthymic disorder: Secondary | ICD-10-CM | POA: Diagnosis not present

## 2019-05-16 MED FILL — NITROFURANTOIN MCR 100 MG C: 100 | 30 days supply | Qty: 30 | Fill #0

## 2019-05-30 MED FILL — NITROFURANTOIN MCR 100 MG C: 100 | 30 days supply | Qty: 30 | Fill #0

## 2019-06-28 DIAGNOSIS — Z20822 Contact with and (suspected) exposure to covid-19: Secondary | ICD-10-CM | POA: Diagnosis not present

## 2019-06-28 DIAGNOSIS — Z03818 Encounter for observation for suspected exposure to other biological agents ruled out: Secondary | ICD-10-CM | POA: Diagnosis not present

## 2019-07-07 DIAGNOSIS — Z8051 Family history of malignant neoplasm of kidney: Secondary | ICD-10-CM | POA: Diagnosis not present

## 2019-07-07 DIAGNOSIS — Z800 Family history of malignant neoplasm of digestive organs: Secondary | ICD-10-CM | POA: Diagnosis not present

## 2019-07-07 DIAGNOSIS — Z808 Family history of malignant neoplasm of other organs or systems: Secondary | ICD-10-CM | POA: Diagnosis not present

## 2019-07-07 DIAGNOSIS — Z803 Family history of malignant neoplasm of breast: Secondary | ICD-10-CM | POA: Diagnosis not present

## 2019-07-07 DIAGNOSIS — Z6820 Body mass index (BMI) 20.0-20.9, adult: Secondary | ICD-10-CM | POA: Diagnosis not present

## 2019-07-07 DIAGNOSIS — Z01419 Encounter for gynecological examination (general) (routine) without abnormal findings: Secondary | ICD-10-CM | POA: Diagnosis not present

## 2019-07-07 DIAGNOSIS — Z801 Family history of malignant neoplasm of trachea, bronchus and lung: Secondary | ICD-10-CM | POA: Diagnosis not present

## 2019-07-08 DIAGNOSIS — Z01419 Encounter for gynecological examination (general) (routine) without abnormal findings: Secondary | ICD-10-CM | POA: Diagnosis not present

## 2019-07-25 MED FILL — SERTRALINE HCL 25 MG TABLET: 25 | 72 days supply | Qty: 145 | Fill #2

## 2019-08-29 DIAGNOSIS — J302 Other seasonal allergic rhinitis: Secondary | ICD-10-CM | POA: Diagnosis not present

## 2019-08-29 DIAGNOSIS — H7292 Unspecified perforation of tympanic membrane, left ear: Secondary | ICD-10-CM | POA: Diagnosis not present

## 2019-08-29 DIAGNOSIS — H9193 Unspecified hearing loss, bilateral: Secondary | ICD-10-CM | POA: Diagnosis not present

## 2019-08-29 DIAGNOSIS — H6983 Other specified disorders of Eustachian tube, bilateral: Secondary | ICD-10-CM | POA: Diagnosis not present

## 2019-08-29 DIAGNOSIS — Z9189 Other specified personal risk factors, not elsewhere classified: Secondary | ICD-10-CM | POA: Diagnosis not present

## 2019-08-29 DIAGNOSIS — Z808 Family history of malignant neoplasm of other organs or systems: Secondary | ICD-10-CM | POA: Diagnosis not present

## 2019-09-08 DIAGNOSIS — Z20828 Contact with and (suspected) exposure to other viral communicable diseases: Secondary | ICD-10-CM | POA: Diagnosis not present

## 2019-09-08 DIAGNOSIS — R05 Cough: Secondary | ICD-10-CM | POA: Diagnosis not present

## 2019-09-09 DIAGNOSIS — Z20828 Contact with and (suspected) exposure to other viral communicable diseases: Secondary | ICD-10-CM | POA: Diagnosis not present

## 2019-09-09 DIAGNOSIS — R05 Cough: Secondary | ICD-10-CM | POA: Diagnosis not present

## 2019-11-12 MED FILL — SERTRALINE HCL 25 MG TABLET: 25 | 72 days supply | Qty: 145 | Fill #3

## 2019-12-18 DIAGNOSIS — H6983 Other specified disorders of Eustachian tube, bilateral: Secondary | ICD-10-CM | POA: Diagnosis not present

## 2019-12-18 DIAGNOSIS — Z01118 Encounter for examination of ears and hearing with other abnormal findings: Secondary | ICD-10-CM | POA: Diagnosis not present

## 2019-12-18 DIAGNOSIS — H90A12 Conductive hearing loss, unilateral, left ear with restricted hearing on the contralateral side: Secondary | ICD-10-CM | POA: Diagnosis not present

## 2019-12-18 DIAGNOSIS — H7292 Unspecified perforation of tympanic membrane, left ear: Secondary | ICD-10-CM | POA: Diagnosis not present

## 2019-12-18 DIAGNOSIS — H9012 Conductive hearing loss, unilateral, left ear, with unrestricted hearing on the contralateral side: Secondary | ICD-10-CM | POA: Diagnosis not present

## 2020-02-11 MED FILL — NITROFURANTOIN MCR 100 MG C: 100 | 30 days supply | Qty: 30 | Fill #1

## 2020-02-13 DIAGNOSIS — Z0000 Encounter for general adult medical examination without abnormal findings: Secondary | ICD-10-CM | POA: Diagnosis not present

## 2020-02-19 ENCOUNTER — Other Ambulatory Visit: Payer: Self-pay | Admitting: Family Medicine

## 2020-02-19 DIAGNOSIS — N644 Mastodynia: Secondary | ICD-10-CM

## 2020-02-19 DIAGNOSIS — Z0000 Encounter for general adult medical examination without abnormal findings: Secondary | ICD-10-CM | POA: Diagnosis not present

## 2020-04-02 ENCOUNTER — Ambulatory Visit
Admission: RE | Admit: 2020-04-02 | Discharge: 2020-04-02 | Disposition: A | Discharge status: Home / Self Care | Payer: PRIVATE HEALTH INSURANCE | Source: Ambulatory Visit | Attending: Family Medicine | Admitting: Family Medicine

## 2020-04-02 ENCOUNTER — Ambulatory Visit: Payer: PRIVATE HEALTH INSURANCE

## 2020-04-02 ENCOUNTER — Other Ambulatory Visit: Payer: Self-pay

## 2020-04-02 DIAGNOSIS — N644 Mastodynia: Secondary | ICD-10-CM

## 2020-04-02 DIAGNOSIS — R922 Inconclusive mammogram: Secondary | ICD-10-CM | POA: Diagnosis not present

## 2020-04-23 ENCOUNTER — Other Ambulatory Visit (HOSPITAL_COMMUNITY): Payer: Self-pay

## 2020-04-23 MED ORDER — SERTRALINE HCL 25 MG PO TABS
ORAL_TABLET | ORAL | 3 refills | Status: DC
  Filled 2020-04-23: qty 180, 90d supply, fill #0

## 2020-05-20 ENCOUNTER — Other Ambulatory Visit (HOSPITAL_COMMUNITY): Payer: Self-pay

## 2020-05-20 DIAGNOSIS — F331 Major depressive disorder, recurrent, moderate: Secondary | ICD-10-CM | POA: Diagnosis not present

## 2020-05-20 DIAGNOSIS — G4700 Insomnia, unspecified: Secondary | ICD-10-CM | POA: Diagnosis not present

## 2020-05-20 DIAGNOSIS — F411 Generalized anxiety disorder: Secondary | ICD-10-CM | POA: Diagnosis not present

## 2020-05-20 MED ORDER — NITROFURANTOIN MACROCRYSTAL 100 MG PO CAPS
ORAL_CAPSULE | ORAL | 3 refills | Status: DC
  Filled 2020-05-20 – 2020-05-31 (×2): qty 30, 30d supply, fill #0
  Filled 2021-04-24: qty 30, 30d supply, fill #1

## 2020-05-20 MED ORDER — SERTRALINE HCL 25 MG PO TABS
25.0000 mg | ORAL_TABLET | Freq: Every morning | ORAL | 3 refills | Status: DC
  Filled 2020-05-20 – 2020-11-05 (×2): qty 90, 90d supply, fill #0

## 2020-05-28 ENCOUNTER — Other Ambulatory Visit (HOSPITAL_COMMUNITY): Payer: Self-pay

## 2020-05-31 ENCOUNTER — Other Ambulatory Visit (HOSPITAL_COMMUNITY): Payer: Self-pay

## 2020-07-07 DIAGNOSIS — Z01419 Encounter for gynecological examination (general) (routine) without abnormal findings: Secondary | ICD-10-CM | POA: Diagnosis not present

## 2020-07-07 DIAGNOSIS — Z6820 Body mass index (BMI) 20.0-20.9, adult: Secondary | ICD-10-CM | POA: Diagnosis not present

## 2020-11-05 ENCOUNTER — Other Ambulatory Visit (HOSPITAL_COMMUNITY): Payer: Self-pay

## 2020-11-26 DIAGNOSIS — H5213 Myopia, bilateral: Secondary | ICD-10-CM | POA: Diagnosis not present

## 2020-12-17 DIAGNOSIS — Z9189 Other specified personal risk factors, not elsewhere classified: Secondary | ICD-10-CM | POA: Diagnosis not present

## 2020-12-17 DIAGNOSIS — G43909 Migraine, unspecified, not intractable, without status migrainosus: Secondary | ICD-10-CM | POA: Diagnosis not present

## 2020-12-17 DIAGNOSIS — Z3009 Encounter for other general counseling and advice on contraception: Secondary | ICD-10-CM | POA: Diagnosis not present

## 2020-12-28 DIAGNOSIS — Z30433 Encounter for removal and reinsertion of intrauterine contraceptive device: Secondary | ICD-10-CM | POA: Diagnosis not present

## 2021-01-19 DIAGNOSIS — Z30431 Encounter for routine checking of intrauterine contraceptive device: Secondary | ICD-10-CM | POA: Diagnosis not present

## 2021-02-16 DIAGNOSIS — Z0000 Encounter for general adult medical examination without abnormal findings: Secondary | ICD-10-CM | POA: Diagnosis not present

## 2021-02-22 DIAGNOSIS — F411 Generalized anxiety disorder: Secondary | ICD-10-CM | POA: Diagnosis not present

## 2021-02-22 DIAGNOSIS — Z23 Encounter for immunization: Secondary | ICD-10-CM | POA: Diagnosis not present

## 2021-02-22 DIAGNOSIS — K5900 Constipation, unspecified: Secondary | ICD-10-CM | POA: Diagnosis not present

## 2021-02-22 DIAGNOSIS — Z0000 Encounter for general adult medical examination without abnormal findings: Secondary | ICD-10-CM | POA: Diagnosis not present

## 2021-04-05 DIAGNOSIS — K5900 Constipation, unspecified: Secondary | ICD-10-CM | POA: Diagnosis not present

## 2021-04-05 DIAGNOSIS — G4700 Insomnia, unspecified: Secondary | ICD-10-CM | POA: Diagnosis not present

## 2021-04-05 DIAGNOSIS — F411 Generalized anxiety disorder: Secondary | ICD-10-CM | POA: Diagnosis not present

## 2021-04-25 ENCOUNTER — Other Ambulatory Visit (HOSPITAL_COMMUNITY): Payer: Self-pay

## 2021-06-01 ENCOUNTER — Other Ambulatory Visit: Payer: Self-pay | Admitting: Family Medicine

## 2021-06-01 DIAGNOSIS — N644 Mastodynia: Secondary | ICD-10-CM

## 2021-06-01 DIAGNOSIS — M79629 Pain in unspecified upper arm: Secondary | ICD-10-CM

## 2021-06-08 ENCOUNTER — Other Ambulatory Visit: Payer: 59

## 2021-06-09 ENCOUNTER — Ambulatory Visit
Admission: RE | Admit: 2021-06-09 | Discharge: 2021-06-09 | Disposition: A | Discharge status: Home / Self Care | Payer: 59 | Source: Ambulatory Visit | Attending: Family Medicine | Admitting: Family Medicine

## 2021-06-09 DIAGNOSIS — N644 Mastodynia: Secondary | ICD-10-CM

## 2021-06-09 DIAGNOSIS — M79629 Pain in unspecified upper arm: Secondary | ICD-10-CM

## 2021-06-09 DIAGNOSIS — R922 Inconclusive mammogram: Secondary | ICD-10-CM | POA: Diagnosis not present

## 2021-07-11 DIAGNOSIS — Z1239 Encounter for other screening for malignant neoplasm of breast: Secondary | ICD-10-CM | POA: Diagnosis not present

## 2021-08-02 ENCOUNTER — Other Ambulatory Visit (HOSPITAL_COMMUNITY): Payer: Self-pay

## 2021-08-02 ENCOUNTER — Ambulatory Visit (INDEPENDENT_AMBULATORY_CARE_PROVIDER_SITE_OTHER): Payer: 59 | Admitting: Physician Assistant

## 2021-08-02 ENCOUNTER — Encounter: Payer: Self-pay | Admitting: Physician Assistant

## 2021-08-02 DIAGNOSIS — D485 Neoplasm of uncertain behavior of skin: Secondary | ICD-10-CM

## 2021-08-02 DIAGNOSIS — L700 Acne vulgaris: Secondary | ICD-10-CM

## 2021-08-02 DIAGNOSIS — Z86018 Personal history of other benign neoplasm: Secondary | ICD-10-CM

## 2021-08-02 DIAGNOSIS — D2271 Melanocytic nevi of right lower limb, including hip: Secondary | ICD-10-CM

## 2021-08-02 DIAGNOSIS — Z1283 Encounter for screening for malignant neoplasm of skin: Secondary | ICD-10-CM | POA: Diagnosis not present

## 2021-08-02 DIAGNOSIS — D225 Melanocytic nevi of trunk: Secondary | ICD-10-CM

## 2021-08-02 MED ORDER — TRETINOIN 0.025 % EX CREA
TOPICAL_CREAM | Freq: Every day | CUTANEOUS | 11 refills | Status: DC
  Filled 2021-08-02: qty 45, 30d supply, fill #0

## 2021-08-02 NOTE — Patient Instructions (Signed)

## 2021-08-04 ENCOUNTER — Other Ambulatory Visit (HOSPITAL_COMMUNITY): Payer: Self-pay

## 2021-08-04 DIAGNOSIS — H109 Unspecified conjunctivitis: Secondary | ICD-10-CM | POA: Diagnosis not present

## 2021-08-04 MED ORDER — ERYTHROMYCIN 5 MG/GM OP OINT
TOPICAL_OINTMENT | OPHTHALMIC | 0 refills | Status: DC
  Filled 2021-08-04: qty 3.5, 10d supply, fill #0

## 2021-08-05 ENCOUNTER — Encounter: Payer: Self-pay | Admitting: Physician Assistant

## 2021-08-05 NOTE — Progress Notes (Signed)
   New Patient   Subjective  Maria Schultz is a 36 y.o. female who presents for the following: New Patient (Initial Visit) (Patient here today for skin check, per patient she's having acne breakouts on her chin that she's been using OTC Differin Gel. Patient would like to know if there's anything else she can use. Personal history of atypical moles. No family history of atypical moles, melanoma or non mole skin cancer. ).   The following portions of the chart were reviewed this encounter and updated as appropriate:  Tobacco  Allergies  Meds  Problems  Med Hx  Surg Hx  Fam Hx      Objective  Well appearing patient in no apparent distress; mood and affect are within normal limits.  A full examination was performed including scalp, head, eyes, ears, nose, lips, neck, chest, axillae, abdomen, back, buttocks, bilateral upper extremities, bilateral lower extremities, hands, feet, fingers, toes, fingernails, and toenails. All findings within normal limits unless otherwise noted below.  Full body skin examination- No signs of NMSC noted at the time of the visit.   Right Popliteal Fossa Bichromic dark nested macule.        Umbilicus Bichromic dark nested macule.        Head - Anterior (Face) Erythematous papules    Assessment & Plan  Encounter for screening for malignant neoplasm of skin  Yearly skin examination   Neoplasm of uncertain behavior of skin (2) Right Popliteal Fossa  Skin / nail biopsy Type of biopsy: tangential   Informed consent: discussed and consent obtained   Timeout: patient name, date of birth, surgical site, and procedure verified   Procedure prep:  Patient was prepped and draped in usual sterile fashion (Non sterile) Prep type:  Chlorhexidine Anesthesia: the lesion was anesthetized in a standard fashion   Anesthetic:  1% lidocaine w/ epinephrine 1-100,000 local infiltration Instrument used: flexible razor blade   Outcome: patient tolerated  procedure well   Post-procedure details: wound care instructions given    Specimen 1 - Surgical pathology Differential Diagnosis: r/o atypia  Check Margins: yes  Umbilicus  Skin / nail biopsy Type of biopsy: tangential   Informed consent: discussed and consent obtained   Timeout: patient name, date of birth, surgical site, and procedure verified   Procedure prep:  Patient was prepped and draped in usual sterile fashion (Non sterile) Prep type:  Chlorhexidine Anesthesia: the lesion was anesthetized in a standard fashion   Anesthetic:  1% lidocaine w/ epinephrine 1-100,000 local infiltration Instrument used: flexible razor blade   Outcome: patient tolerated procedure well   Post-procedure details: wound care instructions given    Specimen 2 - Surgical pathology Differential Diagnosis: r/o atypia  Check Margins: yes  Acne vulgaris Head - Anterior (Face)  tretinoin (RETIN-A) 0.025 % cream - Head - Anterior (Face) Apply topically at bedtime.     I, Letonya Mangels, PA-C, have reviewed all documentation's for this visit.  The documentation on 08/05/21 for the exam, diagnosis, procedures and orders are all accurate and complete.

## 2021-08-09 ENCOUNTER — Telehealth: Payer: Self-pay | Admitting: *Deleted

## 2021-08-09 NOTE — Telephone Encounter (Signed)
Left patient message to call back for results.

## 2021-08-09 NOTE — Telephone Encounter (Signed)
-----   Message from Warren Danes, [place] sent at 08/05/2021 12:50 PM EDT ----- Both have positive margins Mild and moderate so they need to be observed yearly.

## 2021-08-10 ENCOUNTER — Telehealth: Payer: Self-pay

## 2021-08-10 NOTE — Telephone Encounter (Signed)
-----   Message from Warren Danes, [place] sent at 08/05/2021 12:50 PM EDT ----- Both have positive margins Mild and moderate so they need to be observed yearly.

## 2021-08-17 DIAGNOSIS — Z124 Encounter for screening for malignant neoplasm of cervix: Secondary | ICD-10-CM | POA: Diagnosis not present

## 2021-08-17 DIAGNOSIS — Z01419 Encounter for gynecological examination (general) (routine) without abnormal findings: Secondary | ICD-10-CM | POA: Diagnosis not present

## 2021-08-17 DIAGNOSIS — Z681 Body mass index (BMI) 19 or less, adult: Secondary | ICD-10-CM | POA: Diagnosis not present

## 2021-08-18 ENCOUNTER — Other Ambulatory Visit (HOSPITAL_COMMUNITY): Payer: Self-pay

## 2021-08-25 ENCOUNTER — Other Ambulatory Visit (HOSPITAL_COMMUNITY): Payer: Self-pay

## 2021-08-25 MED ORDER — NITROFURANTOIN MACROCRYSTAL 100 MG PO CAPS
ORAL_CAPSULE | ORAL | 1 refills | Status: DC
  Filled 2021-08-25: qty 30, 30d supply, fill #0
  Filled 2022-01-19: qty 30, 30d supply, fill #1

## 2021-08-29 ENCOUNTER — Other Ambulatory Visit (HOSPITAL_COMMUNITY): Payer: Self-pay

## 2021-10-28 DIAGNOSIS — H52222 Regular astigmatism, left eye: Secondary | ICD-10-CM | POA: Diagnosis not present

## 2021-11-23 ENCOUNTER — Other Ambulatory Visit: Payer: Self-pay | Admitting: General Surgery

## 2021-11-23 DIAGNOSIS — Z1239 Encounter for other screening for malignant neoplasm of breast: Secondary | ICD-10-CM

## 2021-12-07 DIAGNOSIS — R55 Syncope and collapse: Secondary | ICD-10-CM | POA: Diagnosis not present

## 2021-12-14 DIAGNOSIS — H6590 Unspecified nonsuppurative otitis media, unspecified ear: Secondary | ICD-10-CM | POA: Diagnosis not present

## 2021-12-14 DIAGNOSIS — E559 Vitamin D deficiency, unspecified: Secondary | ICD-10-CM | POA: Diagnosis not present

## 2021-12-14 DIAGNOSIS — R030 Elevated blood-pressure reading, without diagnosis of hypertension: Secondary | ICD-10-CM | POA: Diagnosis not present

## 2021-12-14 DIAGNOSIS — I10 Essential (primary) hypertension: Secondary | ICD-10-CM | POA: Diagnosis not present

## 2021-12-14 DIAGNOSIS — H8110 Benign paroxysmal vertigo, unspecified ear: Secondary | ICD-10-CM | POA: Diagnosis not present

## 2021-12-14 DIAGNOSIS — J309 Allergic rhinitis, unspecified: Secondary | ICD-10-CM | POA: Diagnosis not present

## 2021-12-16 ENCOUNTER — Other Ambulatory Visit: Payer: 59

## 2021-12-23 ENCOUNTER — Ambulatory Visit
Admission: RE | Admit: 2021-12-23 | Discharge: 2021-12-23 | Disposition: A | Discharge status: Home / Self Care | Payer: 59 | Source: Ambulatory Visit | Attending: General Surgery | Admitting: General Surgery

## 2021-12-23 DIAGNOSIS — Z1239 Encounter for other screening for malignant neoplasm of breast: Secondary | ICD-10-CM

## 2021-12-23 MED ORDER — GADOPICLENOL 0.5 MMOL/ML IV SOLN
6.0000 mL | Freq: Once | INTRAVENOUS | Status: AC | PRN
  Administered 2021-12-23: 6 mL via INTRAVENOUS

## 2021-12-27 ENCOUNTER — Other Ambulatory Visit: Payer: Self-pay | Admitting: Family Medicine

## 2021-12-27 ENCOUNTER — Other Ambulatory Visit (HOSPITAL_COMMUNITY): Payer: Self-pay

## 2021-12-27 DIAGNOSIS — R519 Headache, unspecified: Secondary | ICD-10-CM

## 2021-12-27 DIAGNOSIS — J069 Acute upper respiratory infection, unspecified: Secondary | ICD-10-CM | POA: Diagnosis not present

## 2021-12-27 DIAGNOSIS — F411 Generalized anxiety disorder: Secondary | ICD-10-CM | POA: Diagnosis not present

## 2021-12-27 DIAGNOSIS — H7290 Unspecified perforation of tympanic membrane, unspecified ear: Secondary | ICD-10-CM | POA: Diagnosis not present

## 2021-12-27 DIAGNOSIS — R42 Dizziness and giddiness: Secondary | ICD-10-CM | POA: Diagnosis not present

## 2021-12-27 DIAGNOSIS — K5900 Constipation, unspecified: Secondary | ICD-10-CM | POA: Diagnosis not present

## 2021-12-27 MED ORDER — MECLIZINE HCL 25 MG PO TABS
25.0000 mg | ORAL_TABLET | Freq: Three times a day (TID) | ORAL | 0 refills | Status: AC | PRN
  Filled 2021-12-27: qty 30, 5d supply, fill #0

## 2021-12-28 ENCOUNTER — Other Ambulatory Visit (HOSPITAL_COMMUNITY): Payer: Self-pay

## 2021-12-28 MED ORDER — AMOXICILLIN-POT CLAVULANATE 875-125 MG PO TABS
1.0000 | ORAL_TABLET | Freq: Two times a day (BID) | ORAL | 0 refills | Status: AC
  Filled 2021-12-28: qty 20, 10d supply, fill #0

## 2021-12-30 ENCOUNTER — Other Ambulatory Visit (HOSPITAL_COMMUNITY): Payer: Self-pay

## 2021-12-30 ENCOUNTER — Encounter: Payer: Self-pay | Admitting: Neurology

## 2021-12-30 ENCOUNTER — Telehealth: Payer: Self-pay | Admitting: Neurology

## 2021-12-30 ENCOUNTER — Ambulatory Visit (INDEPENDENT_AMBULATORY_CARE_PROVIDER_SITE_OTHER): Payer: 59 | Admitting: Neurology

## 2021-12-30 VITALS — BP 108/75 | HR 91 | Ht 66.0 in | Wt 125.0 lb

## 2021-12-30 DIAGNOSIS — R42 Dizziness and giddiness: Secondary | ICD-10-CM

## 2021-12-30 DIAGNOSIS — G43709 Chronic migraine without aura, not intractable, without status migrainosus: Secondary | ICD-10-CM | POA: Diagnosis not present

## 2021-12-30 MED ORDER — RIZATRIPTAN BENZOATE 5 MG PO TBDP
5.0000 mg | ORAL_TABLET | ORAL | 3 refills | Status: AC | PRN
  Filled 2021-12-30: qty 10, 30d supply, fill #0

## 2021-12-30 MED ORDER — TOPIRAMATE 50 MG PO TABS
100.0000 mg | ORAL_TABLET | Freq: Two times a day (BID) | ORAL | 11 refills | Status: AC
  Filled 2021-12-30: qty 60, 15d supply, fill #0

## 2021-12-30 NOTE — Telephone Encounter (Signed)
Cancel CT head, instead MRI of brain

## 2021-12-30 NOTE — Progress Notes (Signed)
Chief Complaint  Patient presents with   Dizziness    Rm 13 alone Pt is well, has been having dizziness with headaches 3-4 weeks. She is currently having it daily, symptoms have increased in the last few weeks.  Can come without change of position       ASSESSMENT AND PLAN  [name] is a 36 y.o. female   History of migraine with aura, presenting with recurrent episode of vertigo with head pressure  Most consistent with migraine,  MRI of the brain to rule out structural abnormality  Starting preventive medication Topamax 50 mg titrating to 100 mg every night, may combine with magnesium oxide 400 mg and rebuffed limit 100 mg twice daily   NSAIDs as needed,  Maxalt 5 mg as needed for prolonged severe headaches    DIAGNOSTIC DATA (LABS, IMAGING, TESTING) - I reviewed patient records, labs, notes, testing and imaging myself where available.   MEDICAL HISTORY:  Maria Schultz is a 36 year old female, seen in request by her primary care physician Dr. Rachell Cipro for evaluation of dizziness, initial evaluation December 30, 2021   I reviewed and summarized the referring note.  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  She has a history of chronic migraine headaches, started during her childhood, continuing to college, sometimes starting with visual aura, was given Topamax as preventive medication, eventually outgrew it has not had headache for many years  Early December 2023, 1 day at work, around 1 PM, she had sudden onset of vertigo, room was spinning, it was so severe she has to lie down on the ground, last for couple minutes, that she feels her head pressure, also last for few minutes, she was able to continue rest of the day, but did not feel too good,  She has 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  PHYSICAL EXAMNIATION:  Gen: NAD, conversant, well nourised, well groomed                     Cardiovascular: Regular rate rhythm, no peripheral edema, warm, nontender. Eyes: Conjunctivae clear without exudates or hemorrhage Neck: Supple, no carotid bruits. Pulmonary: Clear to auscultation bilaterally   NEUROLOGICAL EXAM:  MENTAL STATUS: Speech/cognition: Awake, alert, oriented to history taking and casual conversation CRANIAL NERVES: CN II: Visual fields are full to confrontation. Pupils are round equal and briskly reactive to light. CN III, IV, VI: extraocular movement are normal. No ptosis. CN V: Facial sensation is intact to light touch CN VII: Face is symmetric with normal eye closure  CN VIII: Left tympanic membrane perforation, bony conduction more than air conduction on the left side, mild decreased hearing on the left side CN IX, X: Phonation is normal. CN XI: Head turning and shoulder shrug are intact  MOTOR: There is no pronator drift of out-stretched arms. Muscle bulk and tone are normal. Muscle strength is  normal.  REFLEXES: Reflexes are 2+ and symmetric at the biceps, triceps, knees, and ankles. Plantar responses are flexor.  SENSORY: Intact to light touch, pinprick and vibratory sensation are intact in fingers and toes.  COORDINATION: There is no trunk or limb dysmetria noted.  GAIT/STANCE: Posture is normal. Gait is steady with normal steps, base, arm swing, and turning. Heel and toe walking are normal. Tandem gait is normal.  Romberg is absent.  REVIEW OF SYSTEMS:   Full 14 system review of systems performed and notable only for as above All other review of systems were negative.   ALLERGIES: No Known Allergies  HOME MEDICATIONS: Current Outpatient Medications  Medication Sig Dispense Refill   amoxicillin-clavulanate (AUGMENTIN) 875-125 MG tablet Take 1 tablet by mouth 2 (two) times daily until gone 20 tablet 0   fluticasone (FLONASE) 50 MCG/ACT nasal spray Place into both nostrils daily.     levonorgestrel (MIRENA, 52 MG,) 20 MCG/DAY IUD Mirena 21 mcg/24 hours (8 yrs) 52 mg intrauterine device  Take 1 device by intrauterine route.     loratadine (CLARITIN REDITABS) 10 MG dissolvable tablet Take 10 mg by mouth daily.     meclizine (ANTIVERT) 25 MG tablet Take 1 - 2 tablets (25 - 50 mg total) by mouth every 8 (eight) hours as needed for dizziness 30 tablet 0   nitrofurantoin (MACRODANTIN) 100 MG capsule Take 1 capsule by mouth one time when needed after sex for prevention of UTI 30 capsule 3   polyethylene glycol powder (GLYCOLAX/MIRALAX) 17 GM/SCOOP powder Take 1 Container by mouth daily.     Prenatal Vit-Fe Fumarate-FA (PRENATAL MULTIVITAMIN) TABS tablet Take 1 tablet by mouth daily at 12 noon.     No current facility-administered medications for this visit.    PAST MEDICAL HISTORY: Past Medical History:  Diagnosis Date   Atypical mole 08/10/2006   Left Post Shoulder (slight)   Atypical mole 06/24/2008   Left Low Side (slight to moderate)   Headache    IBS (irritable bowel syndrome)    Vaginal delivery     PAST SURGICAL HISTORY: Past Surgical History:  Procedure Laterality Date   RHINOPLASTY      FAMILY HISTORY: Family History  Problem Relation Age of Onset   Migraines Sister    Hypertension Maternal Grandmother    Cancer Maternal Grandmother        breast   Breast cancer Maternal Grandmother    Cancer Maternal Grandfather        lung   Diabetes Paternal Grandmother    Hypertension Mother    Cancer Maternal Aunt      SOCIAL HISTORY: Social History   Socioeconomic History   Marital status: Married    Spouse name: Not on file   Number of children: Not on file   Years of education: Not on file   Highest education level: Not on file  Occupational History   Not on file  Tobacco Use   Smoking status: Never   Smokeless tobacco: Never  Vaping Use   Vaping Use: Never used  Substance and Sexual Activity   Alcohol use: No   Drug use: No   Sexual activity: Not on file  Other Topics Concern   Not on file  Social History Narrative   Not on file   Social Determinants of Health   Financial Resource Strain: Not on file  Food Insecurity: Not on file  Transportation Needs: Not on file  Physical Activity: Not on file  Stress: Not on file  Social Connections: Not on file  Intimate Partner Violence: Not on file      Marcial Pacas, M.D. Ph.D.  [facility] [address] [place], [place] [address] Ph: [phone] Fax: [phone]  CC:  Fanny Bien, MD [address] [place],  [place] [address]  Fanny Bien, MD

## 2022-01-03 ENCOUNTER — Other Ambulatory Visit (HOSPITAL_COMMUNITY): Payer: Self-pay

## 2022-01-03 NOTE — Telephone Encounter (Signed)
I will cancel the CT and have [facility] call her to schedule a MRI instead.

## 2022-01-10 ENCOUNTER — Other Ambulatory Visit: Payer: Commercial Managed Care - PPO

## 2022-01-11 ENCOUNTER — Ambulatory Visit
Admission: RE | Admit: 2022-01-11 | Discharge: 2022-01-11 | Disposition: A | Discharge status: Home / Self Care | Payer: 59 | Source: Ambulatory Visit | Attending: Neurology | Admitting: Neurology

## 2022-01-11 DIAGNOSIS — R42 Dizziness and giddiness: Secondary | ICD-10-CM

## 2022-01-11 DIAGNOSIS — G43709 Chronic migraine without aura, not intractable, without status migrainosus: Secondary | ICD-10-CM | POA: Diagnosis not present

## 2022-01-12 ENCOUNTER — Telehealth: Payer: Self-pay | Admitting: Neurology

## 2022-01-12 NOTE — Telephone Encounter (Signed)
I called the patient, MRI of the brain was normal.  IMPRESSION:    Normal MRI brain (without).

## 2022-01-19 ENCOUNTER — Other Ambulatory Visit (HOSPITAL_COMMUNITY): Payer: Self-pay

## 2022-03-06 DIAGNOSIS — Z13220 Encounter for screening for lipoid disorders: Secondary | ICD-10-CM | POA: Diagnosis not present

## 2022-03-06 DIAGNOSIS — Z0000 Encounter for general adult medical examination without abnormal findings: Secondary | ICD-10-CM | POA: Diagnosis not present

## 2022-03-06 DIAGNOSIS — Z114 Encounter for screening for human immunodeficiency virus [HIV]: Secondary | ICD-10-CM | POA: Diagnosis not present

## 2022-03-13 ENCOUNTER — Other Ambulatory Visit (HOSPITAL_COMMUNITY): Payer: Self-pay

## 2022-03-13 DIAGNOSIS — L239 Allergic contact dermatitis, unspecified cause: Secondary | ICD-10-CM | POA: Diagnosis not present

## 2022-03-13 DIAGNOSIS — Z0000 Encounter for general adult medical examination without abnormal findings: Secondary | ICD-10-CM | POA: Diagnosis not present

## 2022-03-13 MED ORDER — NITROFURANTOIN MACROCRYSTAL 100 MG PO CAPS
100.0000 mg | ORAL_CAPSULE | Freq: Every day | ORAL | 3 refills | Status: AC
  Filled 2022-03-13: qty 30, 30d supply, fill #0
  Filled 2022-11-07: qty 30, 30d supply, fill #1

## 2022-04-26 ENCOUNTER — Other Ambulatory Visit: Payer: Self-pay | Admitting: Family Medicine

## 2022-04-26 DIAGNOSIS — Z1231 Encounter for screening mammogram for malignant neoplasm of breast: Secondary | ICD-10-CM

## 2022-05-19 DIAGNOSIS — Z87898 Personal history of other specified conditions: Secondary | ICD-10-CM | POA: Diagnosis not present

## 2022-05-19 DIAGNOSIS — H6993 Unspecified Eustachian tube disorder, bilateral: Secondary | ICD-10-CM | POA: Diagnosis not present

## 2022-05-19 DIAGNOSIS — H7292 Unspecified perforation of tympanic membrane, left ear: Secondary | ICD-10-CM | POA: Diagnosis not present

## 2022-05-19 DIAGNOSIS — H90A12 Conductive hearing loss, unilateral, left ear with restricted hearing on the contralateral side: Secondary | ICD-10-CM | POA: Diagnosis not present

## 2022-05-19 DIAGNOSIS — J302 Other seasonal allergic rhinitis: Secondary | ICD-10-CM | POA: Diagnosis not present

## 2022-06-16 ENCOUNTER — Ambulatory Visit
Admission: RE | Admit: 2022-06-16 | Discharge: 2022-06-16 | Disposition: A | Discharge status: Home / Self Care | Payer: 59 | Source: Ambulatory Visit | Attending: Family Medicine | Admitting: Family Medicine

## 2022-06-16 DIAGNOSIS — Z1231 Encounter for screening mammogram for malignant neoplasm of breast: Secondary | ICD-10-CM

## 2022-07-28 DIAGNOSIS — L700 Acne vulgaris: Secondary | ICD-10-CM | POA: Diagnosis not present

## 2022-09-01 ENCOUNTER — Other Ambulatory Visit (HOSPITAL_COMMUNITY): Payer: Self-pay

## 2022-09-01 DIAGNOSIS — Z01419 Encounter for gynecological examination (general) (routine) without abnormal findings: Secondary | ICD-10-CM | POA: Diagnosis not present

## 2022-09-01 DIAGNOSIS — Z681 Body mass index (BMI) 19 or less, adult: Secondary | ICD-10-CM | POA: Diagnosis not present

## 2022-09-01 DIAGNOSIS — F3281 Premenstrual dysphoric disorder: Secondary | ICD-10-CM | POA: Diagnosis not present

## 2022-09-01 MED ORDER — FLUOXETINE HCL 20 MG PO CAPS
20.0000 mg | ORAL_CAPSULE | Freq: Every day | ORAL | 3 refills | Status: DC
  Filled 2022-09-01: qty 30, 30d supply, fill #0
  Filled 2022-10-09: qty 30, 30d supply, fill #1
  Filled 2022-11-07: qty 30, 30d supply, fill #2
  Filled 2022-12-12: qty 30, 30d supply, fill #3

## 2022-11-02 ENCOUNTER — Other Ambulatory Visit: Payer: Self-pay | Admitting: Nurse Practitioner

## 2022-11-02 DIAGNOSIS — J0140 Acute pansinusitis, unspecified: Secondary | ICD-10-CM

## 2022-11-02 MED ORDER — AZITHROMYCIN 250 MG PO TABS: ORAL_TABLET | ORAL | 0 refills | Status: AC

## 2022-11-10 ENCOUNTER — Other Ambulatory Visit (HOSPITAL_COMMUNITY): Payer: Self-pay

## 2022-12-01 DIAGNOSIS — N6310 Unspecified lump in the right breast, unspecified quadrant: Secondary | ICD-10-CM | POA: Diagnosis not present

## 2022-12-05 ENCOUNTER — Other Ambulatory Visit: Payer: Self-pay | Admitting: Family Medicine

## 2022-12-05 DIAGNOSIS — N6310 Unspecified lump in the right breast, unspecified quadrant: Secondary | ICD-10-CM

## 2022-12-13 ENCOUNTER — Other Ambulatory Visit (HOSPITAL_COMMUNITY): Payer: Self-pay

## 2022-12-20 DIAGNOSIS — Z1239 Encounter for other screening for malignant neoplasm of breast: Secondary | ICD-10-CM | POA: Diagnosis not present

## 2022-12-20 DIAGNOSIS — Z803 Family history of malignant neoplasm of breast: Secondary | ICD-10-CM | POA: Diagnosis not present

## 2022-12-22 DIAGNOSIS — L578 Other skin changes due to chronic exposure to nonionizing radiation: Secondary | ICD-10-CM | POA: Diagnosis not present

## 2022-12-22 DIAGNOSIS — D225 Melanocytic nevi of trunk: Secondary | ICD-10-CM | POA: Diagnosis not present

## 2022-12-22 DIAGNOSIS — L814 Other melanin hyperpigmentation: Secondary | ICD-10-CM | POA: Diagnosis not present

## 2022-12-22 DIAGNOSIS — L700 Acne vulgaris: Secondary | ICD-10-CM | POA: Diagnosis not present

## 2022-12-22 DIAGNOSIS — D485 Neoplasm of uncertain behavior of skin: Secondary | ICD-10-CM | POA: Diagnosis not present

## 2022-12-22 DIAGNOSIS — D2271 Melanocytic nevi of right lower limb, including hip: Secondary | ICD-10-CM | POA: Diagnosis not present

## 2022-12-26 ENCOUNTER — Encounter: Payer: Self-pay | Admitting: Gastroenterology

## 2022-12-27 ENCOUNTER — Other Ambulatory Visit: Payer: 59

## 2022-12-29 ENCOUNTER — Other Ambulatory Visit (HOSPITAL_COMMUNITY): Payer: Self-pay | Admitting: General Surgery

## 2022-12-29 DIAGNOSIS — K648 Other hemorrhoids: Secondary | ICD-10-CM

## 2022-12-29 MED ORDER — HYDROCORTISONE ACETATE 25 MG RE SUPP: 25.0000 mg | Freq: Two times a day (BID) | RECTAL | 1 refills | Status: AC

## 2023-01-05 ENCOUNTER — Other Ambulatory Visit (HOSPITAL_COMMUNITY): Payer: Self-pay

## 2023-01-08 ENCOUNTER — Other Ambulatory Visit (HOSPITAL_COMMUNITY): Payer: Self-pay

## 2023-01-08 MED ORDER — FLUOXETINE HCL 20 MG PO CAPS
20.0000 mg | ORAL_CAPSULE | Freq: Every day | ORAL | 1 refills | Status: DC
  Filled 2023-01-08: qty 90, 90d supply, fill #0
  Filled 2023-04-12: qty 90, 90d supply, fill #1

## 2023-01-15 DIAGNOSIS — K640 First degree hemorrhoids: Secondary | ICD-10-CM | POA: Diagnosis not present

## 2023-03-08 ENCOUNTER — Other Ambulatory Visit (HOSPITAL_COMMUNITY): Payer: Self-pay

## 2023-03-08 ENCOUNTER — Ambulatory Visit (INDEPENDENT_AMBULATORY_CARE_PROVIDER_SITE_OTHER): Payer: 59 | Admitting: Gastroenterology

## 2023-03-08 ENCOUNTER — Encounter: Payer: Self-pay | Admitting: Gastroenterology

## 2023-03-08 VITALS — BP 98/72 | HR 57 | Ht 66.0 in | Wt 127.0 lb

## 2023-03-08 DIAGNOSIS — K5900 Constipation, unspecified: Secondary | ICD-10-CM

## 2023-03-08 DIAGNOSIS — K625 Hemorrhage of anus and rectum: Secondary | ICD-10-CM | POA: Diagnosis not present

## 2023-03-08 DIAGNOSIS — R194 Change in bowel habit: Secondary | ICD-10-CM | POA: Insufficient documentation

## 2023-03-08 MED ORDER — NA SULFATE-K SULFATE-MG SULF 17.5-3.13-1.6 GM/177ML PO SOLN
1.0000 | Freq: Once | ORAL | 0 refills | Status: AC
  Filled 2023-03-08: qty 354, 1d supply, fill #0

## 2023-03-08 NOTE — Progress Notes (Signed)
 03/08/2023 Pollyann Samples [id number] [birth year]   HISTORY OF PRESENT ILLNESS: This is a pleasant 38 year old female who is new to our office.  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  She is here today with complaints of constipation and rectal bleeding.  She says that over 10 years ago she had a colonoscopy for some mild rectal bleeding when she was living in [place].  She says that they told her she had internal hemorrhoids.  After that she had no issues with her bowel movements, etc.  She says then 2 years ago when she turned 35 she started having issues with constipation.  She discussed with her PCP and was just told to start using MiraLAX.  She has been using MiraLAX and that does help keep her stool soft, but then sometimes she feels uncomfortable to pass flatus as sometimes there may be stool with it, etc.  Says she will see rare red blood on the toilet paper with a bowel movement.  She had an episode in December when she went to Christmas party and ate sushi and then had severe diarrhea following that and that was associated with a large amount of rectal bleeding.  That actually resolved and has not recurred.  She actually followed up with Dr. Maisie Fus in regards to that in January and had an anoscopy and exam with her at [facility] and said she did not have any significant hemorrhoid tissue, etc.  She has had no real changes in her diet, lifestyle, medication, etc.  Past Medical History:  Diagnosis Date   Atypical mole 08/10/2006   Left Post Shoulder (slight)   Atypical mole 06/24/2008   Left Low Side (slight to moderate)   Headache    IBS (irritable bowel syndrome)    Vaginal delivery    Past Surgical History:  Procedure Laterality Date   RHINOPLASTY      reports that she has never smoked. She has never used smokeless tobacco. She reports current alcohol use. She reports that she does not use drugs. family history includes Breast cancer in her maternal grandmother;  Cancer in her maternal aunt, maternal grandfather, and maternal grandmother; Diabetes in her paternal grandmother; Hypertension in her maternal grandmother and mother; Migraines in her sister. No Known Allergies    Outpatient Encounter Medications as of 03/08/2023  Medication Sig   FLUoxetine (PROZAC) 20 MG capsule Take 1 capsule (20 mg total) by mouth daily 2 weeks prior to menses.   fluticasone (FLONASE) 50 MCG/ACT nasal spray Place into both nostrils daily.   hydrocortisone (ANUSOL-HC) 25 MG suppository Place 1 suppository (25 mg total) rectally every 12 (twelve) hours.   levonorgestrel (MIRENA, 52 MG,) 20 MCG/DAY IUD Mirena 21 mcg/24 hours (8 yrs) 52 mg intrauterine device  Take 1 device by intrauterine route.   meclizine (ANTIVERT) 25 MG tablet Take 1 - 2 tablets (25 - 50 mg total) by mouth every 8 (eight) hours as needed for dizziness   nitrofurantoin (MACRODANTIN) 100 MG capsule Take 1 capsule (100 mg total) by mouth one time when needed after sex for prevention of UTI.   polyethylene glycol powder (GLYCOLAX/MIRALAX) 17 GM/SCOOP powder Take 1 Container by mouth daily.   Prenatal Vit-Fe Fumarate-FA (PRENATAL MULTIVITAMIN) TABS tablet Take 1 tablet by mouth daily at 12 noon.   amoxicillin-clavulanate (AUGMENTIN) 875-125 MG tablet Take 1 tablet by mouth 2 (two) times daily until gone (Patient not taking: Reported on 03/08/2023)   azithromycin (ZITHROMAX Z-PAK) 250 MG tablet Take  by mouth as directed for 5 days (Patient not taking: Reported on 03/08/2023)   loratadine (CLARITIN REDITABS) 10 MG dissolvable tablet Take 10 mg by mouth daily. (Patient not taking: Reported on 03/08/2023)   rizatriptan (MAXALT-MLT) 5 MG disintegrating tablet Dissolve 1 tablet (5 mg total) by mouth as needed for migraine. May repeat in 2 hours if needed (Patient not taking: Reported on 03/08/2023)   topiramate (TOPAMAX) 50 MG tablet Take 2 tablets (100 mg total) by mouth 2 (two) times daily. (Patient not taking: Reported  on 03/08/2023)   No facility-administered encounter medications on file as of 03/08/2023.    REVIEW OF SYSTEMS  : All other systems reviewed and negative except where noted in the History of Present Illness.   PHYSICAL EXAM: BP 98/72   Pulse (!) 57   Ht 5\' 6"  (1.676 m)   Wt 127 lb (57.6 kg)   BMI 20.50 kg/m  General: Well developed white female in no acute distress Head: Normocephalic and atraumatic Eyes:  Sclerae anicteric, conjunctiva pink. Ears: Normal auditory acuity Lungs: Clear throughout to auscultation; no W/R/R. Heart:  Slightly bradycardic but regular rhythm.  No M/R/G. Abdomen: Soft, non-distended.  BS present.  Non-tender. Rectal:  Will be done at the time of colonoscopy. Musculoskeletal: Symmetrical with no gross deformities  Skin: No lesions on visible extremities Extremities: No edema  Neurological: Alert oriented x 4, grossly non-focal Psychological:  Alert and cooperative. Normal mood and affect  ASSESSMENT AND PLAN: *Rectal bleeding: Occasionally sees small amounts of bright red blood on the toilet paper with a bowel movement.  In December she had a large amount of bleeding that was accompanied by diarrhea after consuming sushi.  Suspect that was likely some type of GI pathogen.  That bleeding resolved and never recurred.  She was actually evaluated by Dr. Maisie Fus at [facility] and told that she did not have any significant hemorrhoids.  Will plan for colonoscopy with Dr. Leonides Schanz.  The risks, benefits, and alternatives to colonoscopy were discussed with the patient and she consents to proceed.  *Constipation: This was a change in her bowel habits a couple of years ago.  She has been taking MiraLAX daily and that does help, but then feels like the stool is very soft and makes her hesitant to feel comfortable with ever passing flatus.  She believes her TSH has been checked by her PCP and has labs again coming up next month so she will make sure that has been done.  She thinks it  has always been normal.  Has been no changes in diet, etc.  Likely idiopathic.  Will plan for colonoscopy as above.  She will continue the MiraLAX and will try adding in Benefiber 2 teaspoons mixed in 8 ounces of liquid daily to see if that will help with some bulking of the stool.   CC:  Lewis Moccasin, MD

## 2023-03-08 NOTE — Patient Instructions (Addendum)
 Start Benefiber 2 teaspoons in 8 ounces of liquid daily and may increase to twice daily if tolerated.   You have been scheduled for a colonoscopy. Please follow written instructions given to you at your visit today.   If you use inhalers (even only as needed), please bring them with you on the day of your procedure.  DO NOT TAKE 7 DAYS PRIOR TO TEST- Trulicity (dulaglutide) Ozempic, Wegovy (semaglutide) Mounjaro (tirzepatide) Bydureon Bcise (exanatide extended release)  DO NOT TAKE 1 DAY PRIOR TO YOUR TEST Rybelsus (semaglutide) Adlyxin (lixisenatide) Victoza (liraglutide) Byetta (exanatide) ______________________________________________________________________  _______________________________________________________  If your blood pressure at your visit was 140/90 or greater, please contact your primary care physician to follow up on this.  _______________________________________________________  If you are age 26 or older, your body mass index should be between 23-30. Your Body mass index is 20.5 kg/m. If this is out of the aforementioned range listed, please consider follow up with your Primary Care Provider.  If you are age 44 or younger, your body mass index should be between 19-25. Your Body mass index is 20.5 kg/m. If this is out of the aformentioned range listed, please consider follow up with your Primary Care Provider.   ________________________________________________________  The [facility] providers would like to encourage you to use [facility] to communicate with providers for non-urgent requests or questions.  Due to long hold times on the telephone, sending your provider a message by [facility] may be a faster and more efficient way to get a response.  Please allow 48 business hours for a response.  Please remember that this is for non-urgent requests.  _______________________________________________________

## 2023-03-08 NOTE — Progress Notes (Signed)
 I agree with the assessment and plan as outlined by Ms. Maria Schultz.

## 2023-03-16 ENCOUNTER — Other Ambulatory Visit (HOSPITAL_COMMUNITY): Payer: Self-pay

## 2023-03-23 DIAGNOSIS — Z13220 Encounter for screening for lipoid disorders: Secondary | ICD-10-CM | POA: Diagnosis not present

## 2023-03-23 DIAGNOSIS — Z0000 Encounter for general adult medical examination without abnormal findings: Secondary | ICD-10-CM | POA: Diagnosis not present

## 2023-03-26 ENCOUNTER — Encounter: Payer: Self-pay | Admitting: Internal Medicine

## 2023-03-30 DIAGNOSIS — Z0000 Encounter for general adult medical examination without abnormal findings: Secondary | ICD-10-CM | POA: Diagnosis not present

## 2023-04-02 ENCOUNTER — Encounter: Payer: Self-pay | Admitting: Internal Medicine

## 2023-04-02 ENCOUNTER — Ambulatory Visit (AMBULATORY_SURGERY_CENTER): Payer: 59 | Admitting: Internal Medicine

## 2023-04-02 VITALS — BP 102/66 | HR 66 | Temp 97.3°F | Resp 13 | Ht 66.0 in | Wt 127.0 lb

## 2023-04-02 DIAGNOSIS — K625 Hemorrhage of anus and rectum: Secondary | ICD-10-CM | POA: Diagnosis not present

## 2023-04-02 DIAGNOSIS — K5900 Constipation, unspecified: Secondary | ICD-10-CM | POA: Diagnosis not present

## 2023-04-02 DIAGNOSIS — D120 Benign neoplasm of cecum: Secondary | ICD-10-CM

## 2023-04-02 DIAGNOSIS — K635 Polyp of colon: Secondary | ICD-10-CM | POA: Diagnosis not present

## 2023-04-02 DIAGNOSIS — K648 Other hemorrhoids: Secondary | ICD-10-CM | POA: Diagnosis not present

## 2023-04-02 MED ORDER — SODIUM CHLORIDE 0.9 % IV SOLN: 500.0000 mL | Freq: Once | INTRAVENOUS | Status: DC

## 2023-04-02 NOTE — Progress Notes (Signed)
 A/O x 3, gd SR's, VSS, report to RN

## 2023-04-02 NOTE — Patient Instructions (Signed)
 Resume previous diet Continue present medications Await pathology results Handouts/information given for polyps and hemorrhoids  YOU HAD AN ENDOSCOPIC PROCEDURE TODAY AT THE [facility]:   Refer to the procedure report that was given to you for any specific questions about what was found during the examination.  If the procedure report does not answer your questions, please call your gastroenterologist to clarify.  If you requested that your care partner not be given the details of your procedure findings, then the procedure report has been included in a sealed envelope for you to review at your convenience later.  YOU SHOULD EXPECT: Some feelings of bloating in the abdomen. Passage of more gas than usual.  Walking can help get rid of the air that was put into your GI tract during the procedure and reduce the bloating. If you had a lower endoscopy (such as a colonoscopy or flexible sigmoidoscopy) you may notice spotting of blood in your stool or on the toilet paper. If you underwent a bowel prep for your procedure, you may not have a normal bowel movement for a few days.  Please Note:  You might notice some irritation and congestion in your nose or some drainage.  This is from the oxygen used during your procedure.  There is no need for concern and it should clear up in a day or so.  SYMPTOMS TO REPORT IMMEDIATELY:  Following lower endoscopy (colonoscopy):  Excessive amounts of blood in the stool  Significant tenderness or worsening of abdominal pains  Swelling of the abdomen that is new, acute  Fever of 100F or higher  For urgent or emergent issues, a gastroenterologist can be reached at any hour by calling [phone]. Do not use MyChart messaging for urgent concerns.   DIET:  We do recommend a small meal at first, but then you may proceed to your regular diet.  Drink plenty of fluids but you should avoid alcoholic beverages for 24 hours.  ACTIVITY:  You should plan to take  it easy for the rest of today and you should NOT DRIVE or use heavy machinery until tomorrow (because of the sedation medicines used during the test).    FOLLOW UP: Our staff will call the number listed on your records the next business day following your procedure.  We will call around 7:15- 8:00 am to check on you and address any questions or concerns that you may have regarding the information given to you following your procedure. If we do not reach you, we will leave a message.     If any biopsies were taken you will be contacted by phone or by letter within the next 1-3 weeks.  Please call us at [phone] if you have not heard about the biopsies in 3 weeks.    SIGNATURES/CONFIDENTIALITY: You and/or your care partner have signed paperwork which will be entered into your electronic medical record.  These signatures attest to the fact that that the information above on your After Visit Summary has been reviewed and is understood.  Full responsibility of the confidentiality of this discharge information lies with you and/or your care-partner.

## 2023-04-02 NOTE — Progress Notes (Signed)
 GASTROENTEROLOGY PROCEDURE H&P NOTE   Primary Care Physician: Lewis Moccasin, MD    Reason for Procedure:   Rectal bleeding  Plan:    Colonoscopy  Patient is appropriate for endoscopic procedure(s) in the ambulatory ([facility]) setting.  The nature of the procedure, as well as the risks, benefits, and alternatives were carefully and thoroughly reviewed with the patient. Ample time for discussion and questions allowed. The patient understood, was satisfied, and agreed to proceed.     HPI: Maria Schultz is a 38 y.o. female who presents for colonoscopy for evaluation of rectal bleeding.  Patient was most recently seen in the Gastroenterology Clinic on 03/08/23.  No interval change in medical history since that appointment. Please refer to that note for full details regarding GI history and clinical presentation.   Past Medical History:  Diagnosis Date   Atypical mole 08/10/2006   Left Post Shoulder (slight)   Atypical mole 06/24/2008   Left Low Side (slight to moderate)   Headache    IBS (irritable bowel syndrome)    Vaginal delivery     Past Surgical History:  Procedure Laterality Date   RHINOPLASTY      Prior to Admission medications   Medication Sig Start Date End Date Taking? Authorizing Provider  FLUoxetine (PROZAC) 20 MG capsule Take 1 capsule (20 mg total) by mouth daily 2 weeks prior to menses. 01/08/23  Yes   fluticasone (FLONASE) 50 MCG/ACT nasal spray Place into both nostrils daily.   Yes [provider]  levonorgestrel (MIRENA, 52 MG,) 20 MCG/DAY IUD Mirena 21 mcg/24 hours (8 yrs) 52 mg intrauterine device  Take 1 device by intrauterine route.   Yes [provider]  loratadine (CLARITIN REDITABS) 10 MG dissolvable tablet Take 10 mg by mouth daily.   Yes [provider]  polyethylene glycol powder (GLYCOLAX/MIRALAX) 17 GM/SCOOP powder Take 1 Container by mouth daily.   Yes [provider]  rizatriptan (MAXALT-MLT) 5 MG  disintegrating tablet Dissolve 1 tablet (5 mg total) by mouth as needed for migraine. May repeat in 2 hours if needed 12/30/21  Yes Levert Feinstein, MD  topiramate (TOPAMAX) 50 MG tablet Take 2 tablets (100 mg total) by mouth 2 (two) times daily. 12/30/21  Yes Levert Feinstein, MD  amoxicillin-clavulanate (AUGMENTIN) 875-125 MG tablet Take 1 tablet by mouth 2 (two) times daily until gone Patient not taking: Reported on 04/02/2023 12/28/21   Romelle Starcher, DDS  azithromycin (ZITHROMAX Z-PAK) 250 MG tablet Take by mouth as directed for 5 days Patient not taking: Reported on 04/02/2023 11/02/22   Carlean Jews, NP  hydrocortisone (ANUSOL-HC) 25 MG suppository Place 1 suppository (25 mg total) rectally every 12 (twelve) hours. 12/29/22 12/29/23  Romie Levee, MD  meclizine (ANTIVERT) 25 MG tablet Take 1 - 2 tablets (25 - 50 mg total) by mouth every 8 (eight) hours as needed for dizziness 12/27/21     nitrofurantoin (MACRODANTIN) 100 MG capsule Take 1 capsule (100 mg total) by mouth one time when needed after sex for prevention of UTI. 03/13/22     Prenatal Vit-Fe Fumarate-FA (PRENATAL MULTIVITAMIN) TABS tablet Take 1 tablet by mouth daily at 12 noon.    [provider]    Current Outpatient Medications  Medication Sig Dispense Refill   FLUoxetine (PROZAC) 20 MG capsule Take 1 capsule (20 mg total) by mouth daily 2 weeks prior to menses. 90 capsule 1   fluticasone (FLONASE) 50 MCG/ACT nasal spray Place into both nostrils daily.  levonorgestrel (MIRENA, 52 MG,) 20 MCG/DAY IUD Mirena 21 mcg/24 hours (8 yrs) 52 mg intrauterine device  Take 1 device by intrauterine route.     loratadine (CLARITIN REDITABS) 10 MG dissolvable tablet Take 10 mg by mouth daily.     polyethylene glycol powder (GLYCOLAX/MIRALAX) 17 GM/SCOOP powder Take 1 Container by mouth daily.     rizatriptan (MAXALT-MLT) 5 MG disintegrating tablet Dissolve 1 tablet (5 mg total) by mouth as needed for migraine. May repeat in 2 hours  if needed 10 tablet 3   topiramate (TOPAMAX) 50 MG tablet Take 2 tablets (100 mg total) by mouth 2 (two) times daily. 60 tablet 11   amoxicillin-clavulanate (AUGMENTIN) 875-125 MG tablet Take 1 tablet by mouth 2 (two) times daily until gone (Patient not taking: Reported on 04/02/2023) 20 tablet 0   azithromycin (ZITHROMAX Z-PAK) 250 MG tablet Take by mouth as directed for 5 days (Patient not taking: Reported on 04/02/2023) 6 each 0   hydrocortisone (ANUSOL-HC) 25 MG suppository Place 1 suppository (25 mg total) rectally every 12 (twelve) hours. 12 suppository 1   meclizine (ANTIVERT) 25 MG tablet Take 1 - 2 tablets (25 - 50 mg total) by mouth every 8 (eight) hours as needed for dizziness 30 tablet 0   nitrofurantoin (MACRODANTIN) 100 MG capsule Take 1 capsule (100 mg total) by mouth one time when needed after sex for prevention of UTI. 30 capsule 3   Prenatal Vit-Fe Fumarate-FA (PRENATAL MULTIVITAMIN) TABS tablet Take 1 tablet by mouth daily at 12 noon.     Current Facility-Administered Medications  Medication Dose Route Frequency Provider Last Rate Last Admin   0.9 %  sodium chloride infusion  500 mL Intravenous Once Imogene Burn, MD        Allergies as of 04/02/2023   (No Known Allergies)    Family History  Problem Relation Age of Onset   Hypertension Mother    Migraines Sister    Hypertension Maternal Grandmother    Cancer Maternal Grandmother        breast   Breast cancer Maternal Grandmother    Cancer Maternal Grandfather        lung   Diabetes Paternal Grandmother    Cancer Maternal Aunt    Colon cancer Neg Hx    Stomach cancer Neg Hx    Esophageal cancer Neg Hx     Social History   Socioeconomic History   Marital status: Married    Spouse name: Not on file   Number of children: 2   Years of education: Not on file   Highest education level: Not on file  Occupational History   Occupation: RN  Tobacco Use   Smoking status: Never   Smokeless tobacco: Never  Vaping  Use   Vaping status: Never Used  Substance and Sexual Activity   Alcohol use: Yes    Comment: twice a month   Drug use: No   Sexual activity: Not on file  Other Topics Concern   Not on file  Social History Narrative   Not on file   Social Drivers of Health   Financial Resource Strain: Not on file  Food Insecurity: Not on file  Transportation Needs: Not on file  Physical Activity: Not on file  Stress: Not on file  Social Connections: Not on file  Intimate Partner Violence: Not on file    Physical Exam: Vital signs in last 24 hours: BP 125/64   Pulse 64   Temp (!) 97.3 F (36.3  C)   Ht 5\' 6"  (1.676 m)   Wt 127 lb (57.6 kg)   SpO2 100%   BMI 20.50 kg/m  GEN: NAD EYE: Sclerae anicteric ENT: MMM CV: Non-tachycardic Pulm: No increased WOB GI: Soft NEURO:  Alert & Oriented   Eulah Pont, MD [facility]   04/02/2023 2:36 PM

## 2023-04-02 NOTE — Progress Notes (Signed)
 Pt's states no medical or surgical changes since previsit or office visit.

## 2023-04-02 NOTE — Op Note (Signed)
 [facility] Patient Name: Maria Schultz Procedure Date: 04/02/2023 2:33 PM MRN: [id number] Endoscopist: Madelyn Brunner [place] , , [id number] Age: 38 Referring MD:  Date of Birth: 21-Mar-1985 Gender: Female Account #: [id number] Procedure:                Colonoscopy Indications:              Rectal bleeding Medicines:                Monitored Anesthesia Care Procedure:                Pre-Anesthesia Assessment:                           - Prior to the procedure, a History and Physical                            was performed, and patient medications and                            allergies were reviewed. The patient's tolerance of                            previous anesthesia was also reviewed. The risks                            and benefits of the procedure and the sedation                            options and risks were discussed with the patient.                            All questions were answered, and informed consent                            was obtained. Prior Anticoagulants: The patient has                            taken no anticoagulant or antiplatelet agents. ASA                            Grade Assessment: II - A patient with mild systemic                            disease. After reviewing the risks and benefits,                            the patient was deemed in satisfactory condition to                            undergo the procedure.                           After obtaining informed consent, the colonoscope  was passed under direct vision. Throughout the                            procedure, the patient's blood pressure, pulse, and                            oxygen saturations were monitored continuously. The                            Olympus Scope [id number] was introduced through the                            anus and advanced to the the terminal ileum. The                            colonoscopy was performed without  difficulty. The                            patient tolerated the procedure well. The quality                            of the bowel preparation was excellent. The                            terminal ileum, ileocecal valve, appendiceal                            orifice, and rectum were photographed. Scope In: 2:53:06 PM Scope Out: 3:10:19 PM Scope Withdrawal Time: 0 hours 13 minutes 44 seconds  Total Procedure Duration: 0 hours 17 minutes 13 seconds  Findings:                 The terminal ileum appeared normal.                           A 3 mm polyp was found in the cecum. The polyp was                            sessile. The polyp was removed with a cold snare.                            Resection and retrieval were complete.                           Non-bleeding internal hemorrhoids were found during                            retroflexion. The hemorrhoids were small. Complications:            No immediate complications. Estimated Blood Loss:     Estimated blood loss was minimal. Impression:               - The examined portion of the ileum was normal.                           -  One 3 mm polyp in the cecum, removed with a cold                            snare. Resected and retrieved.                           - Non-bleeding internal hemorrhoids. Recommendation:           - Discharge patient to home (with escort).                           - Await pathology results.                           - The findings and recommendations were discussed                            with the patient. Dr Particia Lather "Alan Ripper" Leonides Schanz,  04/02/2023 3:19:15 PM

## 2023-04-03 ENCOUNTER — Telehealth: Payer: Self-pay

## 2023-04-03 NOTE — Telephone Encounter (Signed)
  Follow up Call-     04/02/2023    2:07 PM  Call back number  Post procedure Call Back phone  # [phone]  Permission to leave phone message Yes     Patient questions:  Do you have a fever, pain , or abdominal swelling? No. Pain Score  0 *  Have you tolerated food without any problems? Yes.    Have you been able to return to your normal activities? Yes.    Do you have any questions about your discharge instructions: Diet   No. Medications  No. Follow up visit  No.  Do you have questions or concerns about your Care? No.  Actions: * If pain score is 4 or above: No action needed, pain <4.

## 2023-04-05 ENCOUNTER — Encounter: Payer: Self-pay | Admitting: Internal Medicine

## 2023-04-05 LAB — SURGICAL PATHOLOGY

## 2023-05-22 ENCOUNTER — Other Ambulatory Visit (HOSPITAL_COMMUNITY): Payer: Self-pay

## 2023-05-22 MED ORDER — PREDNISOLONE ACETATE 1 % OP SUSP
1.0000 [drp] | Freq: Four times a day (QID) | OPHTHALMIC | 0 refills | Status: AC
  Filled 2023-05-22: qty 5, 25d supply, fill #0

## 2023-06-19 ENCOUNTER — Other Ambulatory Visit: Payer: Self-pay | Admitting: General Surgery

## 2023-06-19 DIAGNOSIS — Z1239 Encounter for other screening for malignant neoplasm of breast: Secondary | ICD-10-CM

## 2023-06-28 ENCOUNTER — Other Ambulatory Visit: Payer: Self-pay | Admitting: General Surgery

## 2023-06-28 ENCOUNTER — Ambulatory Visit
Admission: RE | Admit: 2023-06-28 | Discharge: 2023-06-28 | Disposition: A | Discharge status: Home / Self Care | Source: Ambulatory Visit | Attending: General Surgery | Admitting: General Surgery

## 2023-06-28 DIAGNOSIS — R928 Other abnormal and inconclusive findings on diagnostic imaging of breast: Secondary | ICD-10-CM

## 2023-06-28 DIAGNOSIS — Z1239 Encounter for other screening for malignant neoplasm of breast: Secondary | ICD-10-CM

## 2023-06-28 MED ORDER — IOPAMIDOL (ISOVUE-370) INJECTION 76%
100.0000 mL | Freq: Once | INTRAVENOUS | Status: AC | PRN
  Administered 2023-06-28: 100 mL via INTRAVENOUS

## 2023-07-03 ENCOUNTER — Other Ambulatory Visit (HOSPITAL_COMMUNITY): Payer: Self-pay

## 2023-07-03 MED ORDER — FLUOXETINE HCL 20 MG PO CAPS
20.0000 mg | ORAL_CAPSULE | Freq: Every day | ORAL | 0 refills | Status: DC
  Filled 2023-07-03: qty 90, 90d supply, fill #0

## 2023-07-06 ENCOUNTER — Ambulatory Visit
Admission: RE | Admit: 2023-07-06 | Discharge: 2023-07-06 | Disposition: A | Discharge status: Home / Self Care | Source: Ambulatory Visit | Attending: General Surgery | Admitting: General Surgery

## 2023-07-06 ENCOUNTER — Other Ambulatory Visit (HOSPITAL_COMMUNITY): Payer: Self-pay

## 2023-07-06 ENCOUNTER — Other Ambulatory Visit (HOSPITAL_BASED_OUTPATIENT_CLINIC_OR_DEPARTMENT_OTHER): Payer: Self-pay

## 2023-07-06 DIAGNOSIS — R928 Other abnormal and inconclusive findings on diagnostic imaging of breast: Secondary | ICD-10-CM

## 2023-07-06 DIAGNOSIS — N6011 Diffuse cystic mastopathy of right breast: Secondary | ICD-10-CM | POA: Diagnosis not present

## 2023-07-06 DIAGNOSIS — R92341 Mammographic extreme density, right breast: Secondary | ICD-10-CM | POA: Diagnosis not present

## 2023-07-06 HISTORY — PX: BREAST BIOPSY: SHX20

## 2023-07-06 MED ORDER — IOPAMIDOL (ISOVUE-370) INJECTION 76%
100.0000 mL | Freq: Once | INTRAVENOUS | Status: AC | PRN
  Administered 2023-07-06: 100 mL via INTRAVENOUS

## 2023-07-06 MED ORDER — NITROFURANTOIN MACROCRYSTAL 100 MG PO CAPS
100.0000 mg | ORAL_CAPSULE | ORAL | 1 refills | Status: AC | PRN
  Filled 2023-07-06: qty 30, 30d supply, fill #0

## 2023-07-09 LAB — SURGICAL PATHOLOGY

## 2023-07-11 ENCOUNTER — Other Ambulatory Visit (HOSPITAL_COMMUNITY): Payer: Self-pay

## 2023-09-03 ENCOUNTER — Other Ambulatory Visit (HOSPITAL_COMMUNITY): Payer: Self-pay

## 2023-09-03 DIAGNOSIS — Z01419 Encounter for gynecological examination (general) (routine) without abnormal findings: Secondary | ICD-10-CM | POA: Diagnosis not present

## 2023-09-03 DIAGNOSIS — F3281 Premenstrual dysphoric disorder: Secondary | ICD-10-CM | POA: Diagnosis not present

## 2023-09-03 DIAGNOSIS — Z1151 Encounter for screening for human papillomavirus (HPV): Secondary | ICD-10-CM | POA: Diagnosis not present

## 2023-09-03 DIAGNOSIS — N390 Urinary tract infection, site not specified: Secondary | ICD-10-CM | POA: Diagnosis not present

## 2023-09-03 DIAGNOSIS — Z124 Encounter for screening for malignant neoplasm of cervix: Secondary | ICD-10-CM | POA: Diagnosis not present

## 2023-09-03 DIAGNOSIS — Z6820 Body mass index (BMI) 20.0-20.9, adult: Secondary | ICD-10-CM | POA: Diagnosis not present

## 2023-09-03 MED ORDER — FLUOXETINE HCL 20 MG PO CAPS
20.0000 mg | ORAL_CAPSULE | Freq: Every day | ORAL | 3 refills | Status: AC
  Filled 2023-09-03 – 2023-09-28 (×2): qty 90, 90d supply, fill #0
  Filled 2024-01-07: qty 90, 90d supply, fill #1

## 2023-09-03 MED ORDER — NITROFURANTOIN MACROCRYSTAL 100 MG PO CAPS
100.0000 mg | ORAL_CAPSULE | Freq: Every day | ORAL | 5 refills | Status: AC | PRN
  Filled 2023-09-03: qty 30, 30d supply, fill #0
  Filled 2024-01-07: qty 30, 30d supply, fill #1

## 2023-09-04 ENCOUNTER — Other Ambulatory Visit (HOSPITAL_COMMUNITY): Payer: Self-pay

## 2023-09-06 ENCOUNTER — Other Ambulatory Visit (HOSPITAL_COMMUNITY): Payer: Self-pay

## 2023-09-28 ENCOUNTER — Other Ambulatory Visit (HOSPITAL_COMMUNITY): Payer: Self-pay

## 2023-12-26 IMAGING — MG DIGITAL DIAGNOSTIC BILAT W/ TOMO W/ CAD
6 of 12 series · 6 of 36 positions shown · non-contrast
Comparison: Previous exam(s).

CLINICAL DATA: Patient describes intermittent tenderness radiating
from the RIGHT axilla to the outer RIGHT breast. Patient is high
risk for breast cancer with a stated lifetime risk assessment
calculation that measures 20% or greater.

EXAM:
DIGITAL DIAGNOSTIC BILATERAL MAMMOGRAM WITH TOMOSYNTHESIS AND CAD;
ULTRASOUND RIGHT BREAST LIMITED
TECHNIQUE: Bilateral digital diagnostic mammography and breast tomosynthesis
was performed. The images were evaluated with computer-aided
detection.; Targeted ultrasound examination of the right breast was
performed

[R XCCL synth-2D (1 of 2)]
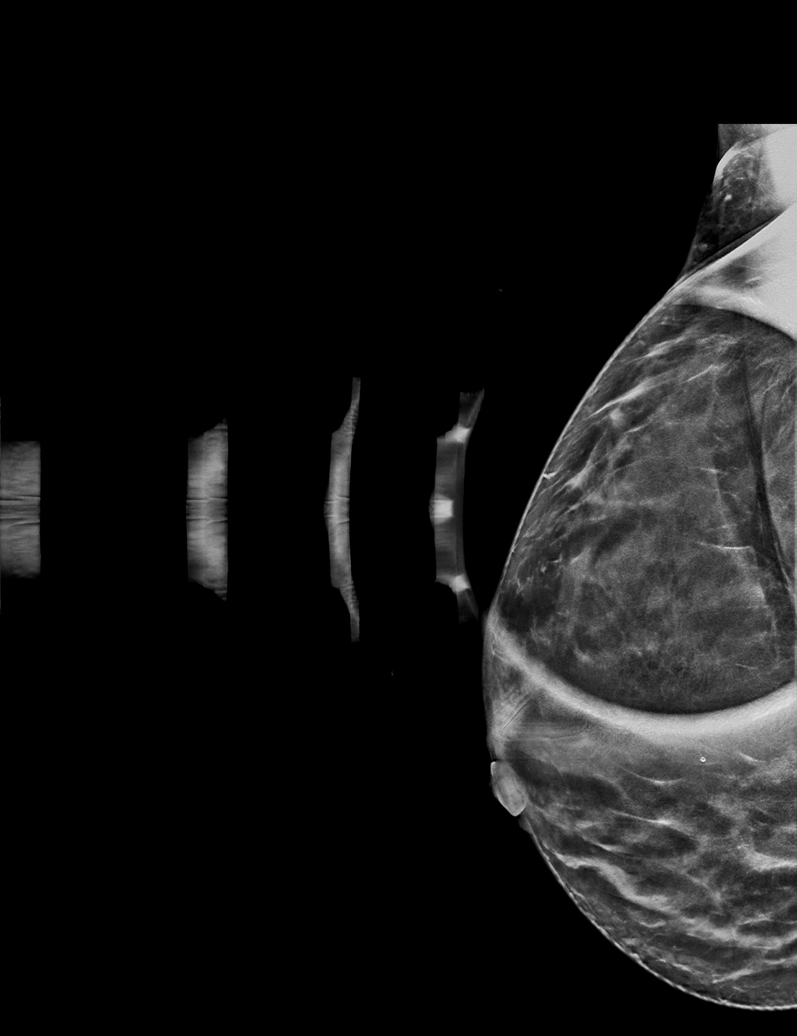

[R MLO synth-2D]
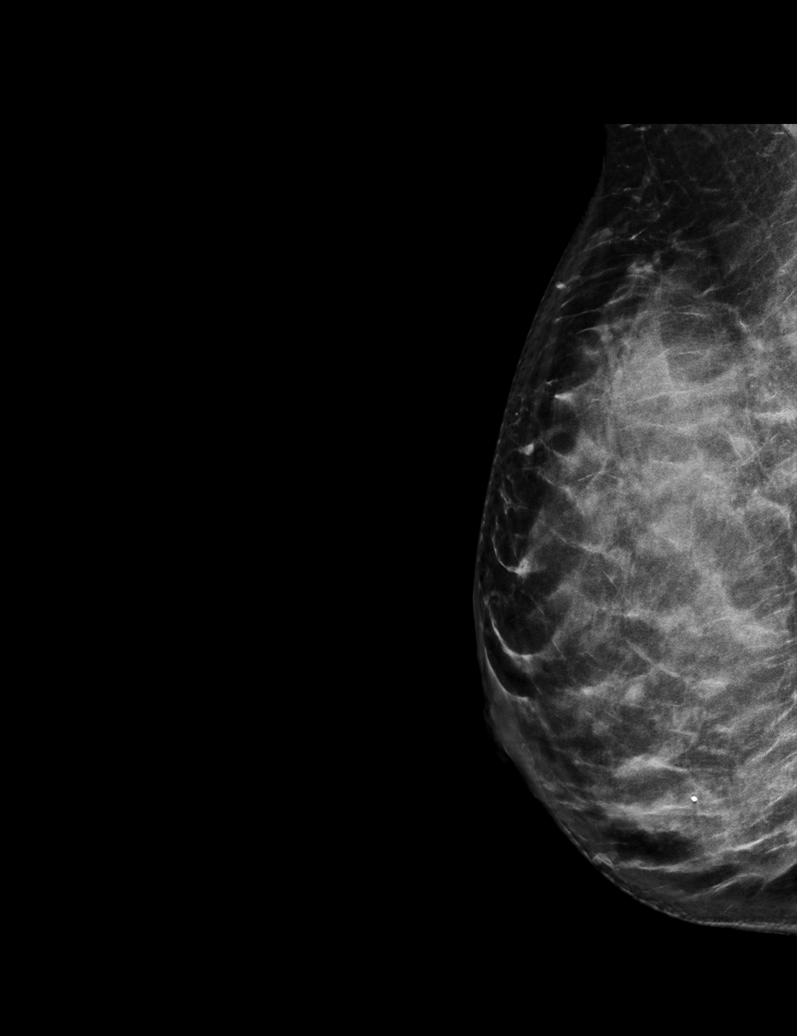

[R XCCL synth-2D (2 of 2)]
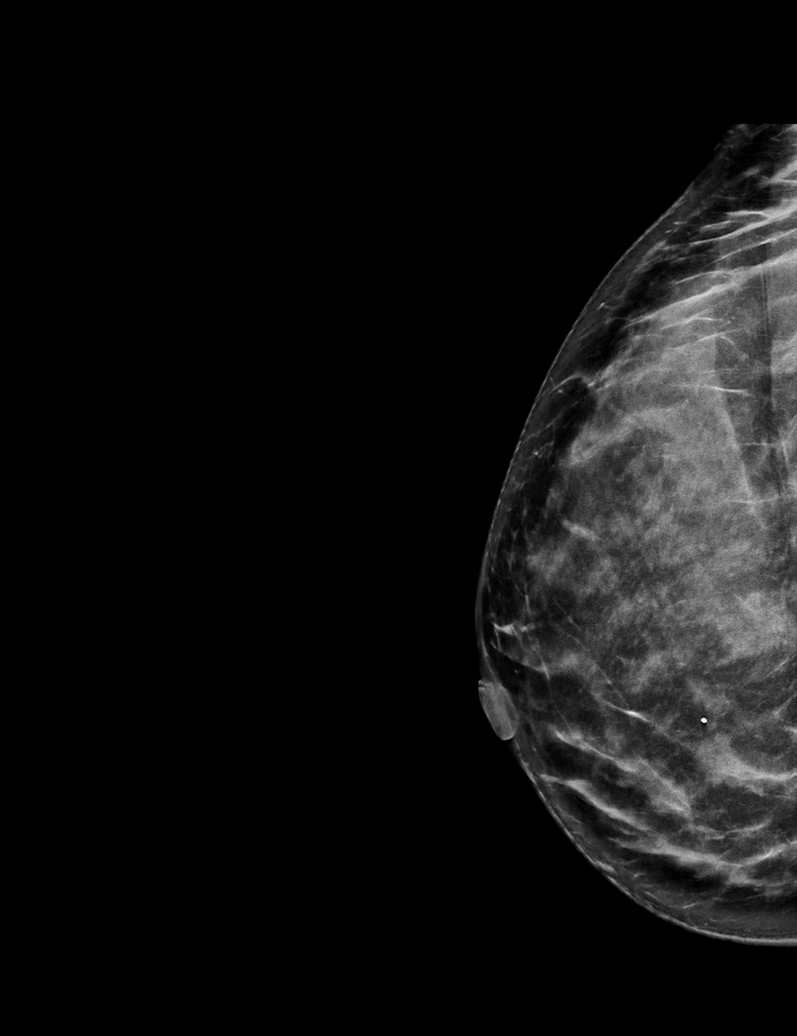

[R CC synth-2D]
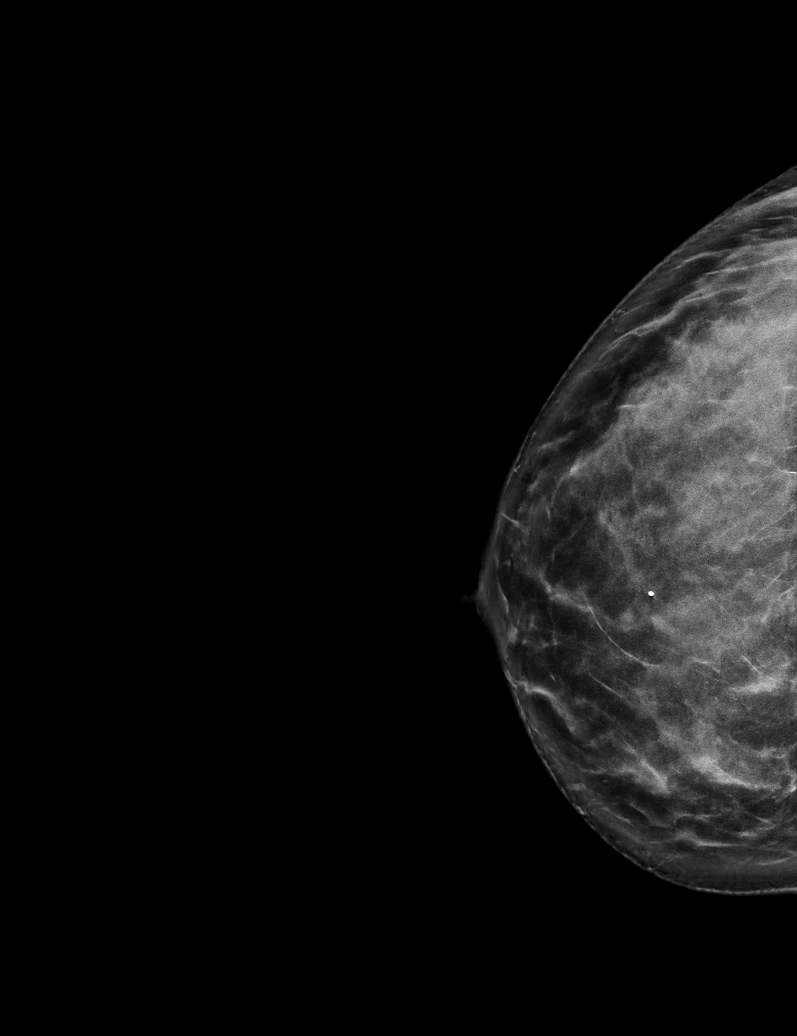

[L CC synth-2D]
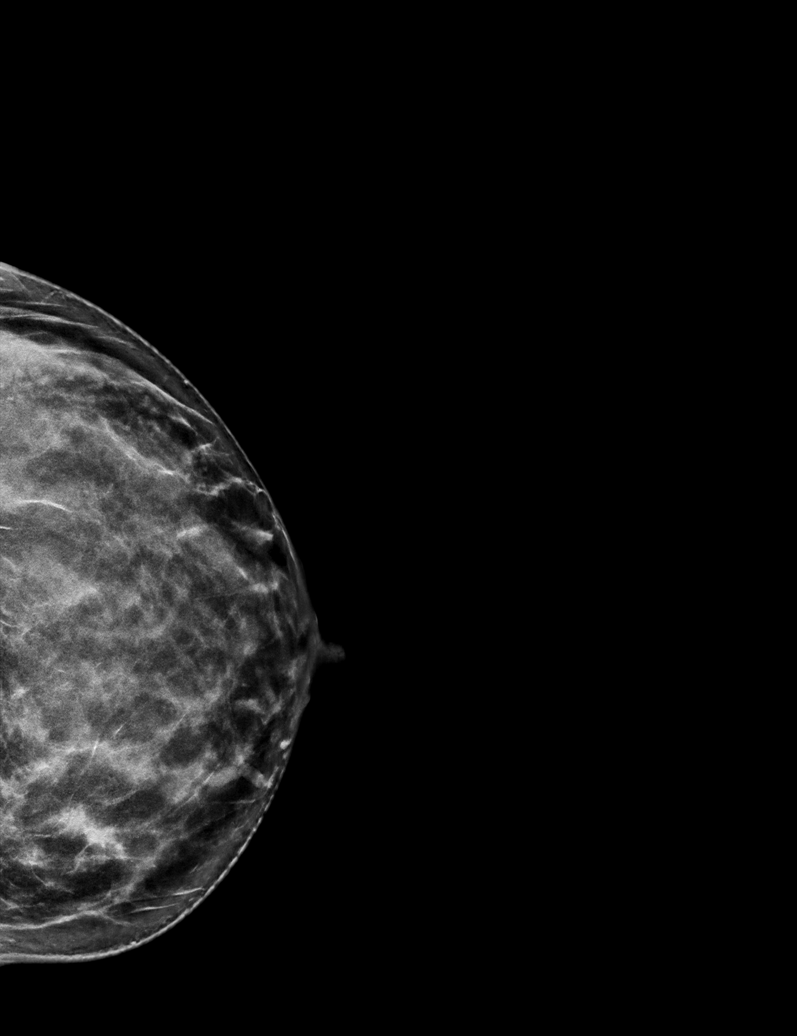

[L MLO synth-2D]
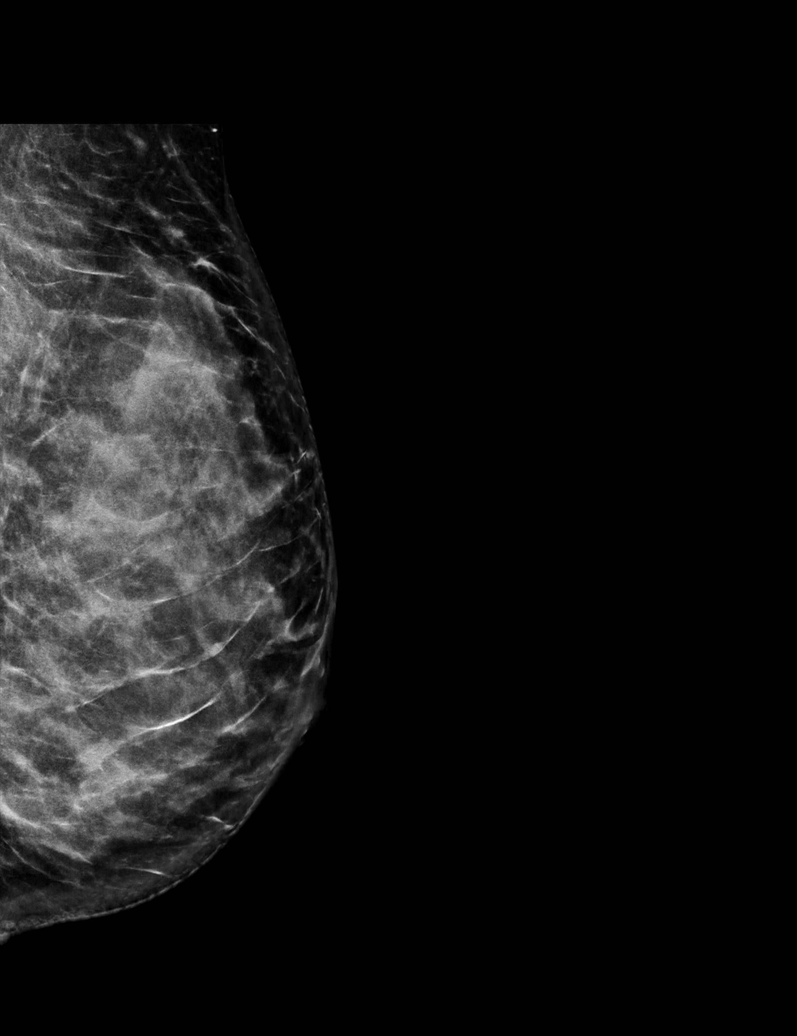

[6 of 36 positions shown; findings below may reference images not displayed]

ACR Breast Density Category d: The breast tissue is extremely dense,
which lowers the sensitivity of mammography.
FINDINGS: There are no new dominant masses, suspicious calcifications or
secondary signs of malignancy identified within either breast.

Targeted ultrasound is performed, evaluating the RIGHT axilla to the
outer RIGHT breast corresponding to the area of patient's clinical
concern, showing only normal fibroglandular tissues and fat lobules
throughout. No solid or cystic mass.
IMPRESSION: No evidence of malignancy within either breast.

RECOMMENDATION:
1. Screening mammogram in one year.(Code:[id number]). Per American
cancer society guidelines, recommend commencement of an annual
bilateral screening mammogram schedule at this time.
2. Given patient's extremely dense breasts and stated lifetime risk
assessment calculation that measures 20% or greater, I would
recommend the addition of annual screening breast MRI to annual
screening mammography. Per [employer] guidelines,
annual screening MRI of the breasts is recommended if a risk
assessment calculation for breast cancer, preferably using the
Tyrer-Cuzick or Gail model, measures greater than or equal to 20% or
for patients who are known or suspected to be positive for the
breast cancer gene. If patient's lifetime risk assessment
calculation was less than 20%, I would still recommend consideration
of abbreviated breast MRI given the extremely dense breast tissues.
This was discussed with the patient today.
3. To facilitate the high risk breast cancer screening recommended
above, consider consultation with [REDACTED]'s high
risk clinic. This was also discussed with the patient today.

I have discussed the findings and recommendations with the patient.
If applicable, a reminder letter will be sent to the patient
regarding the next appointment.

BI-RADS CATEGORY  1: Negative.

## 2024-01-08 ENCOUNTER — Other Ambulatory Visit (HOSPITAL_COMMUNITY): Payer: Self-pay

## 2024-01-08 ENCOUNTER — Other Ambulatory Visit: Payer: Self-pay
